# Patient Record
Sex: Female | Born: 1950 | Race: White | Hispanic: No | Marital: Married | Smoking: Current every day smoker
Health system: Southern US, Community
[De-identification: ages and names within clinical notes are randomized; demographics above are authoritative.]

## PROBLEM LIST (undated history)

## (undated) DIAGNOSIS — M069 Rheumatoid arthritis, unspecified: Secondary | ICD-10-CM

## (undated) DIAGNOSIS — F419 Anxiety disorder, unspecified: Secondary | ICD-10-CM

## (undated) DIAGNOSIS — D649 Anemia, unspecified: Secondary | ICD-10-CM

## (undated) DIAGNOSIS — L408 Other psoriasis: Secondary | ICD-10-CM

## (undated) DIAGNOSIS — M797 Fibromyalgia: Secondary | ICD-10-CM

## (undated) DIAGNOSIS — M1990 Unspecified osteoarthritis, unspecified site: Secondary | ICD-10-CM

## (undated) DIAGNOSIS — E785 Hyperlipidemia, unspecified: Secondary | ICD-10-CM

## (undated) DIAGNOSIS — R011 Cardiac murmur, unspecified: Secondary | ICD-10-CM

## (undated) DIAGNOSIS — M8580 Other specified disorders of bone density and structure, unspecified site: Secondary | ICD-10-CM

## (undated) DIAGNOSIS — K219 Gastro-esophageal reflux disease without esophagitis: Secondary | ICD-10-CM

## (undated) HISTORY — DX: Other specified disorders of bone density and structure, unspecified site: M85.80

## (undated) HISTORY — DX: Other psoriasis: L40.8

## (undated) HISTORY — PX: EYE SURGERY: SHX253

## (undated) HISTORY — DX: Gastro-esophageal reflux disease without esophagitis: K21.9

## (undated) HISTORY — DX: Unspecified osteoarthritis, unspecified site: M19.90

## (undated) HISTORY — DX: Hyperlipidemia, unspecified: E78.5

## (undated) HISTORY — PX: CARPAL TUNNEL RELEASE: SHX101

## (undated) HISTORY — PX: ABDOMINAL HYSTERECTOMY: SHX81

---

## 1978-08-21 HISTORY — PX: ABDOMINAL HYSTERECTOMY: SHX81

## 1999-11-08 ENCOUNTER — Other Ambulatory Visit: Admission: RE | Admit: 1999-11-08 | Discharge: 1999-11-08 | Payer: Self-pay | Admitting: *Deleted

## 1999-11-09 ENCOUNTER — Other Ambulatory Visit: Admission: RE | Admit: 1999-11-09 | Discharge: 1999-11-09 | Payer: Self-pay | Admitting: *Deleted

## 2004-10-17 ENCOUNTER — Ambulatory Visit: Payer: Self-pay | Admitting: Obstetrics and Gynecology

## 2005-05-05 ENCOUNTER — Ambulatory Visit: Payer: Self-pay | Admitting: Specialist

## 2006-07-24 ENCOUNTER — Ambulatory Visit: Payer: Self-pay | Admitting: Obstetrics and Gynecology

## 2008-06-25 ENCOUNTER — Ambulatory Visit: Payer: Self-pay | Admitting: Rheumatology

## 2012-05-10 ENCOUNTER — Ambulatory Visit (INDEPENDENT_AMBULATORY_CARE_PROVIDER_SITE_OTHER): Payer: Medicare Other | Admitting: Internal Medicine

## 2012-05-10 ENCOUNTER — Encounter: Payer: Self-pay | Admitting: Internal Medicine

## 2012-05-10 VITALS — BP 130/90 | HR 90 | Temp 98.2°F | Ht 62.0 in | Wt 159.8 lb

## 2012-05-10 DIAGNOSIS — Z1239 Encounter for other screening for malignant neoplasm of breast: Secondary | ICD-10-CM

## 2012-05-10 DIAGNOSIS — M069 Rheumatoid arthritis, unspecified: Secondary | ICD-10-CM

## 2012-05-10 DIAGNOSIS — IMO0001 Reserved for inherently not codable concepts without codable children: Secondary | ICD-10-CM | POA: Insufficient documentation

## 2012-05-10 DIAGNOSIS — F17200 Nicotine dependence, unspecified, uncomplicated: Secondary | ICD-10-CM

## 2012-05-10 DIAGNOSIS — Z720 Tobacco use: Secondary | ICD-10-CM

## 2012-05-10 DIAGNOSIS — R252 Cramp and spasm: Secondary | ICD-10-CM

## 2012-05-10 DIAGNOSIS — E785 Hyperlipidemia, unspecified: Secondary | ICD-10-CM

## 2012-05-10 NOTE — Assessment & Plan Note (Signed)
Patient with some intermittent muscle cramping. Will get recent lab reports from her rheumatologist including electrolytes, thyroid function, and B12.

## 2012-05-10 NOTE — Assessment & Plan Note (Signed)
Strongly encouraged patient to quit smoking.

## 2012-05-10 NOTE — Progress Notes (Signed)
Subjective:    Patient ID: Wanda Cervantes, female    DOB: [birth year], 61 y.o.   MRN: [id number]  HPI 61 year old female with history of rheumatoid arthritis, osteoarthritis, hyperlipidemia presents to establish care. In regards to rheumatoid arthritis, she reports significant pain in the joints in her feet, legs, and arms. She is currently on disability because of this. She currently uses Naprosyn to help with her symptoms. She has tried methotrexate in the past but was unable to tolerate this medication. She reports that her rheumatologist has offered to try other medications but she has been reluctant because of side effects. She reports that pain is most significant in the morning but then improves throughout the day. She has also undergone steroid injections in her feet in the past with some improvement. She read a report that suggested naltrexone could be used to help treat rheumatoid arthritis. She is interested in starting this medication. She reports that her rheumatologist refused to start the medicine.  She also reports a history of hyperlipidemia. She reports that previous doctors had tried using statin medications but she had sensitivity to these medicines. She would prefer to control lipids with diet.  She is also concerned today about several month history of diffuse cramping in the muscles of her legs, arms, and trunk. This occurs intermittently without exertion. She notes that her rheumatologist recently to labs to try to investigate the cause of cramping but she is unsure of the results of these labs.  Outpatient Encounter Prescriptions as of 05/10/2012  Medication Sig Dispense Refill  . naproxen sodium (ANAPROX) 220 MG tablet Take 220 mg by mouth as needed.       BP 130/90  Pulse 90  Temp 98.2 F (36.8 C) (Oral)  Ht 5\' 2"  (1.575 m)  Wt 159 lb 12 oz (72.462 kg)  BMI 29.22 kg/m2  SpO2 98%  Review of Systems  Constitutional: Negative for fever, chills, appetite change,  fatigue and unexpected weight change.  HENT: Negative for ear pain, congestion, sore throat, trouble swallowing, neck pain, voice change and sinus pressure.   Eyes: Negative for visual disturbance.  Respiratory: Negative for cough, shortness of breath, wheezing and stridor.   Cardiovascular: Negative for chest pain, palpitations and leg swelling.  Gastrointestinal: Negative for nausea, vomiting, abdominal pain, diarrhea, constipation, blood in stool, abdominal distention and anal bleeding.  Genitourinary: Negative for dysuria and flank pain.  Musculoskeletal: Positive for myalgias and arthralgias. Negative for gait problem.  Skin: Negative for color change and rash.  Neurological: Negative for dizziness and headaches.  Hematological: Negative for adenopathy. Does not bruise/bleed easily.  Psychiatric/Behavioral: Negative for suicidal ideas, disturbed wake/sleep cycle and dysphoric mood. The patient is not nervous/anxious.        Objective:   Physical Exam  Constitutional: She is oriented to person, place, and time. She appears well-developed and well-nourished. No distress.  HENT:  Head: Normocephalic and atraumatic.  Right Ear: External ear normal.  Left Ear: External ear normal.  Nose: Nose normal.  Mouth/Throat: Oropharynx is clear and moist. No oropharyngeal exudate.  Eyes: Conjunctivae normal are normal. Pupils are equal, round, and reactive to light. Right eye exhibits no discharge. Left eye exhibits no discharge. No scleral icterus.  Neck: Normal range of motion. Neck supple. No tracheal deviation present. No thyromegaly present.  Cardiovascular: Normal rate, regular rhythm, normal heart sounds and intact distal pulses.  Exam reveals no gallop and no friction rub.   No murmur heard. Pulmonary/Chest: Effort normal and breath sounds  normal. No respiratory distress. She has no wheezes. She has no rales. She exhibits no tenderness.  Abdominal: Soft. Bowel sounds are normal. She  exhibits no distension and no mass. There is no tenderness. There is no rebound and no guarding.  Musculoskeletal: She exhibits no edema and no tenderness.       Right foot: She exhibits decreased range of motion and tenderness.       Left foot: She exhibits decreased range of motion and tenderness.  Lymphadenopathy:    She has no cervical adenopathy.  Neurological: She is alert and oriented to person, place, and time. No cranial nerve deficit. She exhibits normal muscle tone. Coordination normal.  Skin: Skin is warm and dry. No rash noted. She is not diaphoretic. No erythema. No pallor.  Psychiatric: She has a normal mood and affect. Her behavior is normal. Judgment and thought content normal.          Assessment & Plan:

## 2012-05-10 NOTE — Assessment & Plan Note (Signed)
Patient reports history of rheumatoid arthritis. Followed by rheumatology. She has not been able to tolerate methotrexate and prefers not to try standard medications for rheumatoid arthritis because of potential side effects. She continues on Naprosyn with some improvement in her symptoms. She is interested in trying naltrexone. I could not find any data to support the use of this medication. Will request notes from rheumatology.

## 2012-05-10 NOTE — Assessment & Plan Note (Signed)
Patient reports hyperlipidemia in the past. Will get recent lab results from her rheumatologist.

## 2012-05-10 NOTE — Assessment & Plan Note (Signed)
Will set up screening mammogram. 

## 2012-05-16 ENCOUNTER — Other Ambulatory Visit: Payer: Medicare Other

## 2012-05-16 LAB — FECAL OCCULT BLOOD, IMMUNOCHEMICAL: Fecal Occult Bld: NEGATIVE

## 2012-05-21 ENCOUNTER — Telehealth: Payer: Self-pay | Admitting: Internal Medicine

## 2012-05-21 NOTE — Telephone Encounter (Signed)
Dr. Bedelia Person detected a urinary tract infection. Patient is needing a prescription called into [facility] on [address].

## 2012-05-22 NOTE — Telephone Encounter (Signed)
I don't have any results on this or notes from Dr. Rozanna Box. Pt will need to either be seen here in urgent visit for UTI, or ask Dr. Rozanna Box to call in Rx.

## 2012-05-22 NOTE — Telephone Encounter (Signed)
Called patient x 2 on home number lines was busy both times.  I will call back later.

## 2012-05-23 ENCOUNTER — Other Ambulatory Visit: Payer: Self-pay | Admitting: Internal Medicine

## 2012-05-23 ENCOUNTER — Ambulatory Visit (INDEPENDENT_AMBULATORY_CARE_PROVIDER_SITE_OTHER): Payer: Medicare Other | Admitting: Internal Medicine

## 2012-05-23 ENCOUNTER — Telehealth: Payer: Self-pay | Admitting: Internal Medicine

## 2012-05-23 DIAGNOSIS — R300 Dysuria: Secondary | ICD-10-CM

## 2012-05-23 LAB — POCT URINALYSIS DIPSTICK
Leukocytes, UA: NEGATIVE
Nitrite, UA: NEGATIVE
Urobilinogen, UA: 0.2
pH, UA: 7

## 2012-05-23 NOTE — Telephone Encounter (Signed)
Pt is wanting a call about UTI that she has going on.

## 2012-05-23 NOTE — Telephone Encounter (Signed)
Patient coming in today for nurse visit to leave urine sample. 

## 2012-05-23 NOTE — Telephone Encounter (Signed)
Patient advised that she can come in to leave a urine sample today.  She will come in before 12:00 noon.

## 2012-05-24 ENCOUNTER — Other Ambulatory Visit: Payer: Self-pay | Admitting: Internal Medicine

## 2012-05-24 DIAGNOSIS — N390 Urinary tract infection, site not specified: Secondary | ICD-10-CM

## 2012-05-25 MED ORDER — CIPROFLOXACIN HCL 500 MG PO TABS: 500.0000 mg | ORAL_TABLET | Freq: Two times a day (BID) | ORAL | Status: DC

## 2012-05-25 NOTE — Addendum Note (Signed)
Addended by: Ronna Polio A on: 05/25/2012 11:44 AM   Modules accepted: Orders

## 2012-06-14 ENCOUNTER — Ambulatory Visit: Payer: Self-pay | Admitting: Internal Medicine

## 2012-06-30 LAB — HM PAP SMEAR: HM PAP: NEGATIVE

## 2012-07-03 ENCOUNTER — Encounter: Payer: Self-pay | Admitting: Internal Medicine

## 2012-07-03 ENCOUNTER — Ambulatory Visit (INDEPENDENT_AMBULATORY_CARE_PROVIDER_SITE_OTHER): Payer: Medicare Other | Admitting: Internal Medicine

## 2012-07-03 ENCOUNTER — Other Ambulatory Visit (HOSPITAL_COMMUNITY)
Admission: RE | Admit: 2012-07-03 | Discharge: 2012-07-03 | Disposition: A | Discharge status: Home / Self Care | Payer: Medicare Other | Source: Ambulatory Visit | Attending: Internal Medicine | Admitting: Internal Medicine

## 2012-07-03 VITALS — BP 130/80 | HR 95 | Temp 97.6°F | Ht 62.0 in | Wt 161.0 lb

## 2012-07-03 DIAGNOSIS — Z01419 Encounter for gynecological examination (general) (routine) without abnormal findings: Secondary | ICD-10-CM | POA: Insufficient documentation

## 2012-07-03 DIAGNOSIS — Z0000 Encounter for general adult medical examination without abnormal findings: Secondary | ICD-10-CM | POA: Insufficient documentation

## 2012-07-03 DIAGNOSIS — Z1151 Encounter for screening for human papillomavirus (HPV): Secondary | ICD-10-CM | POA: Insufficient documentation

## 2012-07-03 DIAGNOSIS — Z23 Encounter for immunization: Secondary | ICD-10-CM

## 2012-07-03 DIAGNOSIS — E785 Hyperlipidemia, unspecified: Secondary | ICD-10-CM

## 2012-07-03 DIAGNOSIS — R252 Cramp and spasm: Secondary | ICD-10-CM

## 2012-07-03 LAB — CBC WITH DIFFERENTIAL/PLATELET
Basophils Absolute: 0.1 10*3/uL (ref 0.0–0.1)
Eosinophils Absolute: 0.3 10*3/uL (ref 0.0–0.7)
Lymphocytes Relative: 24.8 % (ref 12.0–46.0)
MCHC: 33 g/dL (ref 30.0–36.0)
Monocytes Absolute: 0.7 10*3/uL (ref 0.1–1.0)
Neutro Abs: 5.2 10*3/uL (ref 1.4–7.7)
Neutrophils Relative %: 62.4 % (ref 43.0–77.0)
RDW: 13.9 % (ref 11.5–14.6)

## 2012-07-03 LAB — COMPREHENSIVE METABOLIC PANEL
BUN: 18 mg/dL (ref 6–23)
CO2: 28 mEq/L (ref 19–32)
Calcium: 9.5 mg/dL (ref 8.4–10.5)
Chloride: 101 mEq/L (ref 96–112)
Creatinine, Ser: 0.7 mg/dL (ref 0.4–1.2)
GFR: 99.19 mL/min (ref >60.00)
Glucose, Bld: 102 mg/dL — ABNORMAL HIGH (ref 70–99)

## 2012-07-03 LAB — LIPID PANEL
HDL: 77.3 mg/dL (ref >39.00)
Triglycerides: 69 mg/dL (ref 0.0–149.0)

## 2012-07-03 LAB — VITAMIN B12: Vitamin B-12: 359 pg/mL (ref 211–911)

## 2012-07-03 LAB — LDL CHOLESTEROL, DIRECT: Direct LDL: 175.5 mg/dL

## 2012-07-03 NOTE — Assessment & Plan Note (Signed)
Patient with some persistent muscle cramping. Will check CBC, CMP, and B12 level with labs today.

## 2012-07-03 NOTE — Progress Notes (Signed)
Subjective:    Patient ID: Wanda Cervantes, female    DOB: 03/08/1951, 61 y.o.   MRN: [id number]  HPI 61 year old female with history of rheumatoid arthritis presents for annual exam. She reports she is generally feeling well. She notes some persistent cramping of muscles in her legs which occurs both at rest and on exertion. Reviewed labs from her rheumatologist but they did not include B12 level. She questions whether she may have B12 deficiency. Aside from this, she reports she is feeling well. She recently had a mammogram which she reports was normal. She declines colonoscopy.  Outpatient Encounter Prescriptions as of 07/03/2012  Medication Sig Dispense Refill  . naproxen sodium (ANAPROX) 220 MG tablet Take 220 mg by mouth as needed.      . [DISCONTINUED] ciprofloxacin (CIPRO) 500 MG tablet Take 1 tablet (500 mg total) by mouth 2 (two) times daily.  14 tablet  0   BP 130/80  Pulse 95  Temp 97.6 F (36.4 C) (Oral)  Ht 5\' 2"  (1.575 m)  Wt 161 lb (73.029 kg)  BMI 29.45 kg/m2  SpO2 97%  Review of Systems  Constitutional: Negative for fever, chills, appetite change, fatigue and unexpected weight change.  HENT: Negative for ear pain, congestion, sore throat, trouble swallowing, neck pain, voice change and sinus pressure.   Eyes: Negative for visual disturbance.  Respiratory: Negative for cough, shortness of breath, wheezing and stridor.   Cardiovascular: Negative for chest pain, palpitations and leg swelling.  Gastrointestinal: Negative for nausea, vomiting, abdominal pain, diarrhea, constipation, blood in stool, abdominal distention and anal bleeding.  Genitourinary: Negative for dysuria and flank pain.  Musculoskeletal: Positive for myalgias and arthralgias. Negative for gait problem.  Skin: Negative for color change and rash.  Neurological: Negative for dizziness and headaches.  Hematological: Negative for adenopathy. Does not bruise/bleed easily.  Psychiatric/Behavioral:  Negative for suicidal ideas, sleep disturbance and dysphoric mood. The patient is not nervous/anxious.        Objective:   Physical Exam  Constitutional: She is oriented to person, place, and time. She appears well-developed and well-nourished. No distress.  HENT:  Head: Normocephalic and atraumatic.  Right Ear: External ear normal.  Left Ear: External ear normal.  Nose: Nose normal.  Mouth/Throat: Oropharynx is clear and moist. No oropharyngeal exudate.  Eyes: Conjunctivae normal are normal. Pupils are equal, round, and reactive to light. Right eye exhibits no discharge. Left eye exhibits no discharge. No scleral icterus.  Neck: Normal range of motion. Neck supple. No tracheal deviation present. No thyromegaly present.  Cardiovascular: Normal rate, regular rhythm, normal heart sounds and intact distal pulses.  Exam reveals no gallop and no friction rub.   No murmur heard. Pulmonary/Chest: Effort normal and breath sounds normal. No respiratory distress. She has no wheezes. She has no rales. She exhibits no tenderness.  Abdominal: Soft. Bowel sounds are normal. She exhibits no distension and no mass. There is no tenderness. There is no rebound and no guarding.  Genitourinary: Rectum normal, vagina normal and uterus normal. No breast swelling, tenderness, discharge or bleeding. Pelvic exam was performed with patient supine. There is no rash, tenderness or lesion on the right labia. There is no rash, tenderness or lesion on the left labia. Uterus is not enlarged and not tender. Cervix exhibits no motion tenderness, no discharge and no friability. Right adnexum displays no mass, no tenderness and no fullness. Left adnexum displays no mass, no tenderness and no fullness. No erythema or tenderness around the vagina. No  vaginal discharge found.  Musculoskeletal: Normal range of motion. She exhibits no edema and no tenderness.  Lymphadenopathy:    She has no cervical adenopathy.  Neurological: She is  alert and oriented to person, place, and time. No cranial nerve deficit. She exhibits normal muscle tone. Coordination normal.  Skin: Skin is warm and dry. No rash noted. She is not diaphoretic. No erythema. No pallor.  Psychiatric: She has a normal mood and affect. Her behavior is normal. Judgment and thought content normal.          Assessment & Plan:

## 2012-07-03 NOTE — Assessment & Plan Note (Signed)
General medical exam normal today including breast and pelvic exam. PAP is pending. Will request records on recent mammogram. Will check basic labs today including CBC, CMP, lipids. Follow up 3 months and prn.

## 2012-07-05 ENCOUNTER — Encounter: Payer: Self-pay | Admitting: *Deleted

## 2013-01-01 ENCOUNTER — Ambulatory Visit: Payer: Medicare Other | Admitting: Internal Medicine

## 2013-02-25 ENCOUNTER — Ambulatory Visit: Payer: Medicare Other | Admitting: Internal Medicine

## 2013-03-31 ENCOUNTER — Ambulatory Visit (INDEPENDENT_AMBULATORY_CARE_PROVIDER_SITE_OTHER): Payer: Medicare Other | Admitting: Internal Medicine

## 2013-03-31 ENCOUNTER — Encounter: Payer: Self-pay | Admitting: Internal Medicine

## 2013-03-31 VITALS — BP 152/88 | HR 79 | Temp 98.3°F | Wt 157.0 lb

## 2013-03-31 DIAGNOSIS — L723 Sebaceous cyst: Secondary | ICD-10-CM | POA: Insufficient documentation

## 2013-03-31 DIAGNOSIS — F17200 Nicotine dependence, unspecified, uncomplicated: Secondary | ICD-10-CM

## 2013-03-31 DIAGNOSIS — E785 Hyperlipidemia, unspecified: Secondary | ICD-10-CM

## 2013-03-31 DIAGNOSIS — R252 Cramp and spasm: Secondary | ICD-10-CM

## 2013-03-31 DIAGNOSIS — Z720 Tobacco use: Secondary | ICD-10-CM

## 2013-03-31 LAB — COMPREHENSIVE METABOLIC PANEL
ALT: 18 U/L (ref 0–35)
Albumin: 4.3 g/dL (ref 3.5–5.2)
CO2: 24 mEq/L (ref 19–32)
Calcium: 9.6 mg/dL (ref 8.4–10.5)
Chloride: 105 mEq/L (ref 96–112)
Creatinine, Ser: 0.6 mg/dL (ref 0.4–1.2)
GFR: 108.52 mL/min (ref >60.00)
Potassium: 4.1 mEq/L (ref 3.5–5.1)

## 2013-03-31 LAB — CBC WITH DIFFERENTIAL/PLATELET
Basophils Absolute: 0 10*3/uL (ref 0.0–0.1)
Lymphocytes Relative: 26.9 % (ref 12.0–46.0)
Monocytes Relative: 8.1 % (ref 3.0–12.0)
Platelets: 270 10*3/uL (ref 150.0–400.0)
RDW: 14.9 % — ABNORMAL HIGH (ref 11.5–14.6)

## 2013-03-31 LAB — CK: Total CK: 75 U/L (ref 7–177)

## 2013-03-31 LAB — VITAMIN B12: Vitamin B-12: 285 pg/mL (ref 211–911)

## 2013-03-31 MED ORDER — ROSUVASTATIN CALCIUM 5 MG PO TABS: 5.0000 mg | ORAL_TABLET | Freq: Every day | ORAL | Status: DC

## 2013-03-31 NOTE — Assessment & Plan Note (Signed)
Strongly encouraged smoking cessation. 

## 2013-03-31 NOTE — Progress Notes (Signed)
Subjective:    Patient ID: Wanda Cervantes, female    DOB: [birth year], 62 y.o.   MRN: [id number]  HPI 62 year old female with history of rheumatoid arthritis, tobacco abuse presents for followup. Her primary concern today is diffuse myalgias. This is described as muscle cramping which occurs in her legs and trunk throughout the day. Her rheumatologist started her on a muscle relaxer. She is unsure of the name of this medication. She does not find it to be helpful. The cramping resolves without any intervention.  In regards to hyperlipidemia, she reports she is trying to follow a healthier diet. In the past, she took Lipitor with worsening muscle pain. She would like to consider an alternative medication. She notes a strong family history of high cholesterol and heart disease. She continues to smoke.  Outpatient Encounter Prescriptions as of 03/31/2013  Medication Sig Dispense Refill  . naproxen sodium (ANAPROX) 220 MG tablet Take 220 mg by mouth as needed.       No facility-administered encounter medications on file as of 03/31/2013.   BP 152/88  Pulse 79  Temp(Src) 98.3 F (36.8 C) (Oral)  Wt 157 lb (71.215 kg)  BMI 28.71 kg/m2  SpO2 97%  Review of Systems  Constitutional: Negative for fever, chills, appetite change, fatigue and unexpected weight change.  HENT: Negative for ear pain, congestion, sore throat, trouble swallowing, neck pain, voice change and sinus pressure.   Eyes: Negative for visual disturbance.  Respiratory: Negative for cough, shortness of breath, wheezing and stridor.   Cardiovascular: Negative for chest pain, palpitations and leg swelling.  Gastrointestinal: Negative for nausea, vomiting, abdominal pain, diarrhea, constipation, blood in stool, abdominal distention and anal bleeding.  Genitourinary: Negative for dysuria and flank pain.  Musculoskeletal: Positive for myalgias. Negative for arthralgias and gait problem.  Skin: Negative for color change and rash.   Neurological: Negative for dizziness and headaches.  Hematological: Negative for adenopathy. Does not bruise/bleed easily.  Psychiatric/Behavioral: Negative for suicidal ideas, sleep disturbance and dysphoric mood. The patient is not nervous/anxious.        Objective:   Physical Exam  Constitutional: She is oriented to person, place, and time. She appears well-developed and well-nourished. No distress.  HENT:  Head: Normocephalic and atraumatic.  Right Ear: External ear normal.  Left Ear: External ear normal.  Nose: Nose normal.  Mouth/Throat: Oropharynx is clear and moist. No oropharyngeal exudate.  Eyes: Conjunctivae are normal. Pupils are equal, round, and reactive to light. Right eye exhibits no discharge. Left eye exhibits no discharge. No scleral icterus.  Neck: Normal range of motion. Neck supple. No tracheal deviation present. No thyromegaly present.  Cardiovascular: Normal rate, regular rhythm, normal heart sounds and intact distal pulses.  Exam reveals no gallop and no friction rub.   No murmur heard. Pulmonary/Chest: Effort normal and breath sounds normal. No accessory muscle usage. Not tachypneic. No respiratory distress. She has no decreased breath sounds. She has no wheezes. She has no rhonchi. She has no rales. She exhibits no tenderness.  Musculoskeletal: Normal range of motion. She exhibits no edema and no tenderness.  Lymphadenopathy:    She has no cervical adenopathy.  Neurological: She is alert and oriented to person, place, and time. No cranial nerve deficit. She exhibits normal muscle tone. Coordination normal.  Skin: Skin is warm and dry. No rash noted. She is not diaphoretic. No erythema. No pallor.  Psychiatric: She has a normal mood and affect. Her behavior is normal. Judgment and thought content normal.  Assessment & Plan:

## 2013-03-31 NOTE — Assessment & Plan Note (Signed)
Will recheck electrolytes, thyroid function, total CK with labs today. Strongly encouraged smoking cessation. Patient will get the name of the muscle relaxer she is using and e-mail or call our office at this.

## 2013-03-31 NOTE — Assessment & Plan Note (Signed)
Recurrent sebaceous cyst on the mid upper scalp. Recommended dermatology evaluation for resection. She would like to hold off on this for now. Will followup in 4 weeks.

## 2013-03-31 NOTE — Assessment & Plan Note (Signed)
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 Based on 10 year ASCVD risk calculator her ten-year risk for CVD is 8.9% and recommendation would be to start statin medication. Discussed this with her today. Will try Crestor 5 mg daily. Plan to repeat lipids and LFTs in one month.

## 2013-04-04 ENCOUNTER — Ambulatory Visit (INDEPENDENT_AMBULATORY_CARE_PROVIDER_SITE_OTHER): Payer: Medicare Other | Admitting: *Deleted

## 2013-04-04 DIAGNOSIS — E538 Deficiency of other specified B group vitamins: Secondary | ICD-10-CM

## 2013-04-04 MED ORDER — CYANOCOBALAMIN 1000 MCG/ML IJ SOLN
1000.0000 ug | Freq: Once | INTRAMUSCULAR | Status: AC
  Administered 2013-04-04: 1000 ug via INTRAMUSCULAR

## 2013-04-10 ENCOUNTER — Ambulatory Visit (INDEPENDENT_AMBULATORY_CARE_PROVIDER_SITE_OTHER): Payer: Medicare Other | Admitting: *Deleted

## 2013-04-10 DIAGNOSIS — E538 Deficiency of other specified B group vitamins: Secondary | ICD-10-CM

## 2013-04-10 MED ORDER — CYANOCOBALAMIN 1000 MCG/ML IJ SOLN
1000.0000 ug | Freq: Once | INTRAMUSCULAR | Status: AC
  Administered 2013-04-10: 1000 ug via INTRAMUSCULAR

## 2013-04-11 ENCOUNTER — Telehealth: Payer: Self-pay | Admitting: *Deleted

## 2013-04-11 ENCOUNTER — Ambulatory Visit: Payer: Medicare Other

## 2013-04-11 NOTE — Telephone Encounter (Signed)
There are no steroids in this medication. However, I agree she should stop the medication. We should list as allergy

## 2013-04-11 NOTE — Telephone Encounter (Signed)
Patient left a voicemail stating she was having some facial swelling since starting the Crestor. She has also noticed more sensitivity in her skin, was wondering if any steroids was in that medication. Called and spoke with patient, informed her that she should not take anymore of the medication until I spoke to Dr. Dan Humphreys about this.

## 2013-04-14 NOTE — Telephone Encounter (Signed)
LMTCB

## 2013-04-15 NOTE — Telephone Encounter (Signed)
I would recommend following a Mediterranean style diet and setting a goal of aerobic exercise 3x per week.

## 2013-04-15 NOTE — Telephone Encounter (Signed)
Patient informed and verbalized understanding. Sent a copy of diet.

## 2013-04-15 NOTE — Telephone Encounter (Signed)
Spoke with patient, she did stop taking the medication. But she would like to know if taking Omega-3 fish oil help lower her cholesterol or is there anything that could help with that. Please advise.

## 2013-04-17 ENCOUNTER — Ambulatory Visit (INDEPENDENT_AMBULATORY_CARE_PROVIDER_SITE_OTHER): Payer: Medicare Other | Admitting: *Deleted

## 2013-04-17 DIAGNOSIS — E538 Deficiency of other specified B group vitamins: Secondary | ICD-10-CM

## 2013-04-17 MED ORDER — CYANOCOBALAMIN 1000 MCG/ML IJ SOLN
1000.0000 ug | Freq: Once | INTRAMUSCULAR | Status: AC
  Administered 2013-04-17: 1000 ug via INTRAMUSCULAR

## 2013-05-05 ENCOUNTER — Ambulatory Visit: Payer: Medicare Other | Admitting: Internal Medicine

## 2013-05-20 ENCOUNTER — Ambulatory Visit (INDEPENDENT_AMBULATORY_CARE_PROVIDER_SITE_OTHER): Payer: Medicare Other | Admitting: *Deleted

## 2013-05-20 DIAGNOSIS — E538 Deficiency of other specified B group vitamins: Secondary | ICD-10-CM

## 2013-05-20 MED ORDER — CYANOCOBALAMIN 1000 MCG/ML IJ SOLN
1000.0000 ug | Freq: Once | INTRAMUSCULAR | Status: AC
  Administered 2013-05-20: 1000 ug via INTRAMUSCULAR

## 2013-06-24 ENCOUNTER — Ambulatory Visit (INDEPENDENT_AMBULATORY_CARE_PROVIDER_SITE_OTHER): Payer: Medicare Other | Admitting: *Deleted

## 2013-06-24 DIAGNOSIS — E538 Deficiency of other specified B group vitamins: Secondary | ICD-10-CM

## 2013-06-24 MED ORDER — CYANOCOBALAMIN 1000 MCG/ML IJ SOLN
1000.0000 ug | Freq: Once | INTRAMUSCULAR | Status: AC
  Administered 2013-06-24: 1000 ug via INTRAMUSCULAR

## 2013-06-30 LAB — HM MAMMOGRAPHY: HM Mammogram: NORMAL

## 2013-07-01 ENCOUNTER — Ambulatory Visit: Payer: Self-pay | Admitting: Internal Medicine

## 2013-07-08 ENCOUNTER — Telehealth: Payer: Self-pay | Admitting: *Deleted

## 2013-07-08 NOTE — Telephone Encounter (Signed)
She will need to be seen in a visit.

## 2013-07-08 NOTE — Telephone Encounter (Signed)
Patient left a message on voicemail stating she went to [facility] over the weekend and was diagnosed with shingles. They did give her some pain medication but it is not helping. She has enough family members in this field that agree she need something more. It does not have to be another pain med, maybe something to help with the nerve ending. Called and spoke with patient they gave her acyclovir 800 mg and Hydrocodone APAP 5/325 mg. She has a Engineer, civil (consulting) and a 911 dispatcher in the family and they both agree that something in addition to this should be given to her to help with the nerve pain.

## 2013-07-08 NOTE — Telephone Encounter (Signed)
Informed patient she will need to come in for an office visit, appointment scheduled for tomorrow with Raquel.

## 2013-07-09 ENCOUNTER — Ambulatory Visit (INDEPENDENT_AMBULATORY_CARE_PROVIDER_SITE_OTHER): Payer: Medicare Other | Admitting: Adult Health

## 2013-07-09 VITALS — BP 132/74 | HR 90 | Temp 98.2°F | Wt 159.0 lb

## 2013-07-09 DIAGNOSIS — B0229 Other postherpetic nervous system involvement: Secondary | ICD-10-CM

## 2013-07-09 MED ORDER — GABAPENTIN 300 MG PO CAPS: 300.0000 mg | ORAL_CAPSULE | Freq: Three times a day (TID) | ORAL | Status: DC

## 2013-07-09 NOTE — Patient Instructions (Signed)
Start gabapentin 300 mg three times a day. Calamine lotion to the area as needed.   Shingles Shingles (herpes zoster) is an infection that is caused by the same virus that causes chickenpox (varicella). The infection causes a painful skin rash and fluid-filled blisters, which eventually break open, crust over, and heal. It may occur in any area of the body, but it usually affects only one side of the body or face. The pain of shingles usually lasts about 1 month. However, some people with shingles may develop long-term (chronic) pain in the affected area of the body. Shingles often occurs many years after the person had chickenpox. It is more common:  In people older than 50 years.  In people with weakened immune systems, such as those with HIV, AIDS, or cancer.  In people taking medicines that weaken the immune system, such as transplant medicines.  In people under great stress. CAUSES  Shingles is caused by the varicella zoster virus (VZV), which also causes chickenpox. After a person is infected with the virus, it can remain in the person's body for years in an inactive state (dormant). To cause shingles, the virus reactivates and breaks out as an infection in a nerve root. The virus can be spread from person to person (contagious) through contact with open blisters of the shingles rash. It will only spread to people who have not had chickenpox. When these people are exposed to the virus, they may develop chickenpox. They will not develop shingles. Once the blisters scab over, the person is no longer contagious and cannot spread the virus to others. SYMPTOMS  Shingles shows up in stages. The initial symptoms may be pain, itching, and tingling in an area of the skin. This pain is usually described as burning, stabbing, or throbbing.In a few days or weeks, a painful red rash will appear in the area where the pain, itching, and tingling were felt. The rash is usually on one side of the body in a  band or belt-like pattern. Then, the rash usually turns into fluid-filled blisters. They will scab over and dry up in approximately 2 3 weeks. Flu-like symptoms may also occur with the initial symptoms, the rash, or the blisters. These may include:  Fever.  Chills.  Headache.  Upset stomach. DIAGNOSIS  Your caregiver will perform a skin exam to diagnose shingles. Skin scrapings or fluid samples may also be taken from the blisters. This sample will be examined under a microscope or sent to a lab for further testing. TREATMENT  There is no specific cure for shingles. Your caregiver will likely prescribe medicines to help you manage the pain, recover faster, and avoid long-term problems. This may include antiviral drugs, anti-inflammatory drugs, and pain medicines. HOME CARE INSTRUCTIONS   Take a cool bath or apply cool compresses to the area of the rash or blisters as directed. This may help with the pain and itching.   Only take over-the-counter or prescription medicines as directed by your caregiver.   Rest as directed by your caregiver.  Keep your rash and blisters clean with mild soap and cool water or as directed by your caregiver.  Do not pick your blisters or scratch your rash. Apply an anti-itch cream or numbing creams to the affected area as directed by your caregiver.  Keep your shingles rash covered with a loose bandage (dressing).  Avoid skin contact with:  Babies.   Pregnant women.   Children with eczema.   Elderly people with transplants.  People with chronic illnesses, such as leukemia or AIDS.   Wear loose-fitting clothing to help ease the pain of material rubbing against the rash.  Keep all follow-up appointments with your caregiver.If the area involved is on your face, you may receive a referral for follow-up to a specialist, such as an eye doctor (ophthalmologist) or an ear, nose, and throat (ENT) doctor. Keeping all follow-up appointments will  help you avoid eye complications, chronic pain, or disability.  SEEK IMMEDIATE MEDICAL CARE IF:   You have facial pain, pain around the eye area, or loss of feeling on one side of your face.  You have ear pain or ringing in your ear.  You have loss of taste.  Your pain is not relieved with prescribed medicines.   Your redness or swelling spreads.   You have more pain and swelling.  Your condition is worsening or has changed.   You have a feveror persistent symptoms for more than 2 3 days.  You have a fever and your symptoms suddenly get worse. MAKE SURE YOU:  Understand these instructions.  Will watch your condition.  Will get help right away if you are not doing well or get worse. Document Released: 08/07/2005 Document Revised: 05/01/2012 Document Reviewed: 03/21/2012 [facility] Patient Information 2014 [place], [place].

## 2013-07-09 NOTE — Assessment & Plan Note (Signed)
Gabapentin 300 mg tid. RTC if no improvement in symptoms.

## 2013-07-09 NOTE — Progress Notes (Signed)
  Subjective:    Patient ID: Wanda Cervantes, female    DOB: [birth year], 62 y.o.   MRN: [id number]  HPI  Diagnosed with shingles and started on hydrocodone, acyclovir. She reports that hydrocodone is not helping her pain. Pain is rated 10/10. She reports that aleve helps somewhat.  Needs something specific for nerve pain.   Current Outpatient Prescriptions on File Prior to Visit  Medication Sig Dispense Refill  . methocarbamol (ROBAXIN) 500 MG tablet Take 500 mg by mouth 2 (two) times daily as needed.      . naproxen sodium (ANAPROX) 220 MG tablet Take 220 mg by mouth as needed.      . rosuvastatin (CRESTOR) 5 MG tablet Take 1 tablet (5 mg total) by mouth daily.  90 tablet  3   No current facility-administered medications on file prior to visit.     Review of Systems  Skin: Positive for rash.       Shingles right side of back and around to the abdomen  Neurological:       Nerve ending pain 2/2 shingles       Objective:   Physical Exam  Constitutional: She is oriented to person, place, and time. No distress.  Cardiovascular: Normal rate and regular rhythm.   Pulmonary/Chest: Effort normal. No respiratory distress.  Neurological: She is alert and oriented to person, place, and time.  Skin: Rash noted.  Shingles rash  Psychiatric: She has a normal mood and affect. Her behavior is normal. Judgment and thought content normal.    .BP 132/74  Pulse 90  Temp(Src) 98.2 F (36.8 C) (Oral)  Wt 159 lb (72.122 kg)  SpO2 97%      Assessment & Plan:

## 2013-07-11 ENCOUNTER — Encounter: Payer: Self-pay | Admitting: Internal Medicine

## 2013-07-24 ENCOUNTER — Ambulatory Visit (INDEPENDENT_AMBULATORY_CARE_PROVIDER_SITE_OTHER): Payer: Medicare Other | Admitting: *Deleted

## 2013-07-24 DIAGNOSIS — E538 Deficiency of other specified B group vitamins: Secondary | ICD-10-CM

## 2013-07-24 MED ORDER — CYANOCOBALAMIN 1000 MCG/ML IJ SOLN
1000.0000 ug | Freq: Once | INTRAMUSCULAR | Status: AC
  Administered 2013-07-24: 1000 ug via INTRAMUSCULAR

## 2013-08-26 ENCOUNTER — Encounter (INDEPENDENT_AMBULATORY_CARE_PROVIDER_SITE_OTHER): Payer: Self-pay

## 2013-08-26 ENCOUNTER — Ambulatory Visit (INDEPENDENT_AMBULATORY_CARE_PROVIDER_SITE_OTHER): Payer: Medicare HMO | Admitting: *Deleted

## 2013-08-26 DIAGNOSIS — E538 Deficiency of other specified B group vitamins: Secondary | ICD-10-CM

## 2013-08-26 MED ORDER — CYANOCOBALAMIN 1000 MCG/ML IJ SOLN
1000.0000 ug | Freq: Once | INTRAMUSCULAR | Status: AC
  Administered 2013-08-26: 1000 ug via INTRAMUSCULAR

## 2013-09-26 ENCOUNTER — Ambulatory Visit (INDEPENDENT_AMBULATORY_CARE_PROVIDER_SITE_OTHER): Payer: Medicare HMO | Admitting: *Deleted

## 2013-09-26 DIAGNOSIS — E538 Deficiency of other specified B group vitamins: Secondary | ICD-10-CM

## 2013-09-26 MED ORDER — CYANOCOBALAMIN 1000 MCG/ML IJ SOLN
1000.0000 ug | Freq: Once | INTRAMUSCULAR | Status: AC
  Administered 2013-09-26: 1000 ug via INTRAMUSCULAR

## 2013-09-30 ENCOUNTER — Telehealth: Payer: Self-pay | Admitting: Emergency Medicine

## 2013-09-30 NOTE — Telephone Encounter (Signed)
Referral underway for [facility]

## 2013-10-02 NOTE — Telephone Encounter (Signed)
[place] states referral not needed to [facility] provider

## 2013-10-28 ENCOUNTER — Ambulatory Visit (INDEPENDENT_AMBULATORY_CARE_PROVIDER_SITE_OTHER): Payer: Medicare HMO | Admitting: *Deleted

## 2013-10-28 ENCOUNTER — Telehealth: Payer: Self-pay | Admitting: *Deleted

## 2013-10-28 DIAGNOSIS — E538 Deficiency of other specified B group vitamins: Secondary | ICD-10-CM

## 2013-10-28 MED ORDER — CYANOCOBALAMIN 1000 MCG/ML IJ SOLN
1000.0000 ug | Freq: Once | INTRAMUSCULAR | Status: AC
  Administered 2013-10-28: 1000 ug via INTRAMUSCULAR

## 2013-10-28 NOTE — Telephone Encounter (Signed)
That is fine 

## 2013-10-28 NOTE — Telephone Encounter (Signed)
Pt notified, appt scheduled 11/18/13.

## 2013-10-28 NOTE — Telephone Encounter (Signed)
Pt in for her B12 injection. Asking if she can take B12 injections q 3 weeks due to muscle cramping that begins about 3 weeks after each injection.

## 2013-11-18 ENCOUNTER — Ambulatory Visit (INDEPENDENT_AMBULATORY_CARE_PROVIDER_SITE_OTHER): Payer: Medicare HMO | Admitting: *Deleted

## 2013-11-18 DIAGNOSIS — E538 Deficiency of other specified B group vitamins: Secondary | ICD-10-CM

## 2013-11-18 MED ORDER — CYANOCOBALAMIN 1000 MCG/ML IJ SOLN
1000.0000 ug | Freq: Once | INTRAMUSCULAR | Status: AC
  Administered 2013-11-18: 1000 ug via INTRAMUSCULAR

## 2013-12-09 ENCOUNTER — Ambulatory Visit (INDEPENDENT_AMBULATORY_CARE_PROVIDER_SITE_OTHER): Payer: Medicare HMO | Admitting: *Deleted

## 2013-12-09 DIAGNOSIS — E538 Deficiency of other specified B group vitamins: Secondary | ICD-10-CM

## 2013-12-09 MED ORDER — CYANOCOBALAMIN 1000 MCG/ML IJ SOLN
1000.0000 ug | Freq: Once | INTRAMUSCULAR | Status: AC
  Administered 2013-12-09: 1000 ug via INTRAMUSCULAR

## 2013-12-24 ENCOUNTER — Ambulatory Visit (INDEPENDENT_AMBULATORY_CARE_PROVIDER_SITE_OTHER): Payer: Commercial Managed Care - HMO | Admitting: Internal Medicine

## 2013-12-24 ENCOUNTER — Encounter: Payer: Self-pay | Admitting: Internal Medicine

## 2013-12-24 VITALS — BP 142/80 | HR 99 | Temp 97.7°F | Wt 159.0 lb

## 2013-12-24 DIAGNOSIS — Z0000 Encounter for general adult medical examination without abnormal findings: Secondary | ICD-10-CM | POA: Diagnosis not present

## 2013-12-24 DIAGNOSIS — L821 Other seborrheic keratosis: Secondary | ICD-10-CM | POA: Diagnosis not present

## 2013-12-24 DIAGNOSIS — M069 Rheumatoid arthritis, unspecified: Secondary | ICD-10-CM

## 2013-12-24 NOTE — Progress Notes (Signed)
Subjective:    Patient ID: Lenna Gilford, female    DOB: 1950-11-17, 63 y.o.   MRN: [id number]  HPI 63YO female presents for follow up.  RA - Followed in the past by Dr. Ruthell Rummage, but she is no longer on her insurance plan. Continues to have diffuse arthralgia, most prominent in her hands. Hands are stiff, particularly in the morning. Left shoulder also painful with limited ROM because of pain. Using Aleve with minimal improvement.  Mole - Concerned about mole on her back which is sometimes itchy. Unsure how long this has been present.  Review of Systems  Constitutional: Negative for fever, chills, appetite change, fatigue and unexpected weight change.  HENT: Negative for congestion, ear pain, sinus pressure, sore throat, trouble swallowing and voice change.   Eyes: Negative for visual disturbance.  Respiratory: Negative for cough, shortness of breath, wheezing and stridor.   Cardiovascular: Negative for chest pain, palpitations and leg swelling.  Gastrointestinal: Negative for nausea, vomiting, abdominal pain, diarrhea, constipation, blood in stool, abdominal distention and anal bleeding.  Genitourinary: Negative for dysuria and flank pain.  Musculoskeletal: Positive for arthralgias and joint swelling. Negative for gait problem, myalgias and neck pain.  Skin: Negative for color change and rash.  Neurological: Negative for dizziness and headaches.  Hematological: Negative for adenopathy. Does not bruise/bleed easily.  Psychiatric/Behavioral: Negative for suicidal ideas, sleep disturbance and dysphoric mood. The patient is not nervous/anxious.        Objective:    BP 142/80  Pulse 99  Temp(Src) 97.7 F (36.5 C) (Oral)  Wt 159 lb (72.122 kg)  SpO2 94% Physical Exam  Constitutional: She is oriented to person, place, and time. She appears well-developed and well-nourished. No distress.  HENT:  Head: Normocephalic and atraumatic.  Right Ear: External ear normal.  Left Ear:  External ear normal.  Nose: Nose normal.  Mouth/Throat: Oropharynx is clear and moist. No oropharyngeal exudate.  Eyes: Conjunctivae are normal. Pupils are equal, round, and reactive to light. Right eye exhibits no discharge. Left eye exhibits no discharge. No scleral icterus.  Neck: Normal range of motion. Neck supple. No tracheal deviation present. No thyromegaly present.  Cardiovascular: Normal rate, regular rhythm, normal heart sounds and intact distal pulses.  Exam reveals no gallop and no friction rub.   No murmur heard. Pulmonary/Chest: Effort normal and breath sounds normal. No accessory muscle usage. Not tachypneic. No respiratory distress. She has no decreased breath sounds. She has no wheezes. She has no rhonchi. She has no rales. She exhibits no tenderness.  Musculoskeletal: She exhibits no edema.       Left shoulder: She exhibits decreased range of motion, tenderness and pain.       Right hand: She exhibits decreased range of motion and tenderness.       Left hand: She exhibits decreased range of motion and tenderness.  Lymphadenopathy:    She has no cervical adenopathy.  Neurological: She is alert and oriented to person, place, and time. No cranial nerve deficit. She exhibits normal muscle tone. Coordination normal.  Skin: Skin is warm and dry. Lesion (brown papule middle back approx 1cm diameter) noted. No rash noted. She is not diaphoretic. No erythema. No pallor.  Psychiatric: She has a normal mood and affect. Her behavior is normal. Judgment and thought content normal.          Assessment & Plan:   Problem List Items Addressed This Visit   Rheumatoid arthritis - Primary     Progressive  rheumatoid arthritis. Has been unable to tolerate medications except for Naprosyn in the past. Will set up new rheumatology evaluation.     Relevant Orders      Ambulatory referral to Rheumatology   Seborrheic keratoses     Seborrheic keratosis mid back. Discussed benign nature of  these lesions. However, recommended general follow up for skin cancer screening with dermatology. Referral placed.    Relevant Orders      Ambulatory referral to Dermatology    Other Visit Diagnoses   Routine general medical examination at a health care facility        Relevant Orders       CBC with Differential       Comprehensive metabolic panel       Lipid panel       Microalbumin / creatinine urine ratio       Vit D  25 hydroxy (rtn osteoporosis monitoring)        Return in about 4 weeks (around 01/21/2014) for Physical.

## 2013-12-24 NOTE — Assessment & Plan Note (Signed)
Seborrheic keratosis mid back. Discussed benign nature of these lesions. However, recommended general follow up for skin cancer screening with dermatology. Referral placed.

## 2013-12-24 NOTE — Patient Instructions (Signed)
Seborrheic Keratosis  Seborrheic keratosis is a common, noncancerous (benign) skin growth that can occur anywhere on the skin.It looks like "stuck-on," waxy, rough, tan, brown, or black spots on the skin. These skin growths can be flat or raised.They are often called "barnacles" because of their pasted-on appearance.Usually, these skin growths appear in adulthood, around age 61, and increase in number as you age. They may also develop during pregnancy or following estrogen therapy. Many people may only have one growth appear in their lifetime, while some people may develop many growths.  CAUSES  It is unknown what causes these skin growths, but they appear to run in families.  SYMPTOMS  Seborrheic keratosis is often located on the face, chest, shoulders, back, or other areas. These growths are:   Usually painless, but may become irritated and itchy.   Yellow, brown, black, or other colors.   Slightly raised or have a flat surface.   Sometimes rough or wart-like in texture.   Often waxy on the surface.   Round or oval-shaped.   Sometimes "stuck-on" in appearance.   Sometimes single, but there are usually many growths.  Any growth that bleeds, itches on a regular basis, becomes inflamed, or becomes irritated needs to be evaluated by a skin specialist (dermatologist).  DIAGNOSIS  Diagnosis is mainly based on the way the growths appear. In some cases, it can be difficult to tell this type of skin growth from skin cancer. A skin growth tissue sample (biopsy) may be used to confirm the diagnosis.  TREATMENT  Most often, treatment is not needed because the skin growths are benign.If the skin growth is irritated easily by clothing or jewelry, causing it to scab or bleed, treatment may be recommended. Patients may also choose to have the growths removed because they do not like their appearance. Most commonly, these growths are treated with cryosurgery.   In cryosurgery, liquid nitrogen is applied to "freeze" the growth. The growth usually falls off within a matter of days. A blister may form and dry into a scab that will also fall off. After the growth or scab falls off, it may leave a dark or light spot on the skin. This color may fade over time, or it may remain permanent on the skin.  HOME CARE INSTRUCTIONS  If the skin growths are treated with cryosurgery, the treated area needs to be kept clean with water and soap.  SEEK MEDICAL CARE IF:   You have questions about these growths or other skin problems.   You develop new symptoms, including:   A change in the appearance of the skin growth.   New growths.   Any bleeding, itching, or pain in the growths.   A skin growth that looks similar to seborrheic keratosis.  Document Released: 09/09/2010 Document Revised: 10/30/2011 Document Reviewed: 09/09/2010  [employer] Patient Information 2014 [employer].

## 2013-12-24 NOTE — Assessment & Plan Note (Signed)
Progressive rheumatoid arthritis. Has been unable to tolerate medications except for Naprosyn in the past. Will set up new rheumatology evaluation.

## 2013-12-24 NOTE — Progress Notes (Signed)
Pre visit review using our clinic review tool, if applicable. No additional management support is needed unless otherwise documented below in the visit note. 

## 2013-12-25 ENCOUNTER — Telehealth: Payer: Self-pay | Admitting: Internal Medicine

## 2013-12-25 NOTE — Telephone Encounter (Signed)
Relevant patient education assigned to patient using Emmi. ° °

## 2013-12-30 ENCOUNTER — Ambulatory Visit: Payer: Medicare HMO

## 2014-01-02 ENCOUNTER — Other Ambulatory Visit: Payer: Self-pay | Admitting: Internal Medicine

## 2014-01-02 ENCOUNTER — Other Ambulatory Visit (INDEPENDENT_AMBULATORY_CARE_PROVIDER_SITE_OTHER): Payer: Medicare HMO

## 2014-01-02 ENCOUNTER — Telehealth: Payer: Self-pay | Admitting: Internal Medicine

## 2014-01-02 ENCOUNTER — Ambulatory Visit (INDEPENDENT_AMBULATORY_CARE_PROVIDER_SITE_OTHER): Payer: Medicare HMO | Admitting: *Deleted

## 2014-01-02 DIAGNOSIS — Z780 Asymptomatic menopausal state: Secondary | ICD-10-CM

## 2014-01-02 DIAGNOSIS — E538 Deficiency of other specified B group vitamins: Secondary | ICD-10-CM

## 2014-01-02 DIAGNOSIS — Z1211 Encounter for screening for malignant neoplasm of colon: Secondary | ICD-10-CM

## 2014-01-02 DIAGNOSIS — Z0000 Encounter for general adult medical examination without abnormal findings: Secondary | ICD-10-CM

## 2014-01-02 LAB — COMPREHENSIVE METABOLIC PANEL
ALBUMIN: 4.1 g/dL (ref 3.5–5.2)
ALT: 23 U/L (ref 0–35)
AST: 18 U/L (ref 0–37)
Alkaline Phosphatase: 78 U/L (ref 39–117)
BUN: 19 mg/dL (ref 6–23)
CO2: 26 mEq/L (ref 19–32)
Calcium: 9 mg/dL (ref 8.4–10.5)
Chloride: 107 mEq/L (ref 96–112)
Creatinine, Ser: 0.6 mg/dL (ref 0.4–1.2)
GFR: 108.24 mL/min (ref >60.00)
GLUCOSE: 98 mg/dL (ref 70–99)
POTASSIUM: 4.2 meq/L (ref 3.5–5.1)
Sodium: 141 mEq/L (ref 135–145)
TOTAL PROTEIN: 6.9 g/dL (ref 6.0–8.3)
Total Bilirubin: 1 mg/dL (ref 0.2–1.2)

## 2014-01-02 LAB — CBC WITH DIFFERENTIAL/PLATELET
BASOS ABS: 0 10*3/uL (ref 0.0–0.1)
Basophils Relative: 0.6 % (ref 0.0–3.0)
Eosinophils Absolute: 0.4 10*3/uL (ref 0.0–0.7)
Eosinophils Relative: 4.3 % (ref 0.0–5.0)
HCT: 41.2 % (ref 36.0–46.0)
Hemoglobin: 13.8 g/dL (ref 12.0–15.0)
LYMPHS ABS: 2.4 10*3/uL (ref 0.7–4.0)
LYMPHS PCT: 28.9 % (ref 12.0–46.0)
MCHC: 33.5 g/dL (ref 30.0–36.0)
MCV: 92.5 fl (ref 78.0–100.0)
MONO ABS: 0.9 10*3/uL (ref 0.1–1.0)
MONOS PCT: 10.1 % (ref 3.0–12.0)
NEUTROS ABS: 4.7 10*3/uL (ref 1.4–7.7)
NEUTROS PCT: 56.1 % (ref 43.0–77.0)
Platelets: 251 10*3/uL (ref 150.0–400.0)
RBC: 4.46 Mil/uL (ref 3.87–5.11)
RDW: 14.7 % (ref 11.5–15.5)
WBC: 8.4 10*3/uL (ref 4.0–10.5)

## 2014-01-02 LAB — LIPID PANEL
CHOLESTEROL: 271 mg/dL — AB (ref 0–200)
HDL: 85.6 mg/dL (ref >39.00)
LDL Cholesterol: 173 mg/dL — ABNORMAL HIGH (ref 0–99)
Total CHOL/HDL Ratio: 3
Triglycerides: 62 mg/dL (ref 0.0–149.0)
VLDL: 12.4 mg/dL (ref 0.0–40.0)

## 2014-01-02 LAB — MICROALBUMIN / CREATININE URINE RATIO
MICROALB/CREAT RATIO: 1 mg/g (ref 0.0–30.0)
Microalb, Ur: 1.4 mg/dL (ref 0.0–1.9)

## 2014-01-02 MED ORDER — CYANOCOBALAMIN 1000 MCG/ML IJ SOLN
1000.0000 ug | Freq: Once | INTRAMUSCULAR | Status: AC
Start: 2014-01-02 — End: 2014-01-02
  Administered 2014-01-02: 1000 ug via INTRAMUSCULAR

## 2014-01-02 NOTE — Telephone Encounter (Signed)
Received fax from [facility] back Auth# [id number] Treating Provider Nehemiah Massed # of Visits 6 Start Date 6.23.15 End Date 12.21.15 CPT 99499 DX 702.19, [id number].[place]

## 2014-01-03 LAB — VITAMIN D 25 HYDROXY (VIT D DEFICIENCY, FRACTURES): VIT D 25 HYDROXY: 43 ng/mL (ref 30–89)

## 2014-01-23 ENCOUNTER — Ambulatory Visit (INDEPENDENT_AMBULATORY_CARE_PROVIDER_SITE_OTHER): Payer: Medicare HMO | Admitting: *Deleted

## 2014-01-23 DIAGNOSIS — E538 Deficiency of other specified B group vitamins: Secondary | ICD-10-CM

## 2014-01-23 MED ORDER — CYANOCOBALAMIN 1000 MCG/ML IJ SOLN
1000.0000 ug | Freq: Once | INTRAMUSCULAR | Status: AC
  Administered 2014-01-23: 1000 ug via INTRAMUSCULAR

## 2014-01-28 ENCOUNTER — Encounter: Payer: Self-pay | Admitting: Internal Medicine

## 2014-01-28 ENCOUNTER — Ambulatory Visit (INDEPENDENT_AMBULATORY_CARE_PROVIDER_SITE_OTHER): Payer: Medicare HMO | Admitting: Internal Medicine

## 2014-01-28 VITALS — BP 146/80 | HR 94 | Temp 97.9°F | Ht 61.0 in | Wt 158.0 lb

## 2014-01-28 DIAGNOSIS — Z0000 Encounter for general adult medical examination without abnormal findings: Secondary | ICD-10-CM

## 2014-01-28 DIAGNOSIS — M069 Rheumatoid arthritis, unspecified: Secondary | ICD-10-CM

## 2014-01-28 DIAGNOSIS — Z1211 Encounter for screening for malignant neoplasm of colon: Secondary | ICD-10-CM | POA: Insufficient documentation

## 2014-01-28 DIAGNOSIS — R252 Cramp and spasm: Secondary | ICD-10-CM | POA: Insufficient documentation

## 2014-01-28 DIAGNOSIS — E7849 Other hyperlipidemia: Secondary | ICD-10-CM

## 2014-01-28 DIAGNOSIS — M25512 Pain in left shoulder: Secondary | ICD-10-CM | POA: Insufficient documentation

## 2014-01-28 DIAGNOSIS — M25519 Pain in unspecified shoulder: Secondary | ICD-10-CM

## 2014-01-28 DIAGNOSIS — E785 Hyperlipidemia, unspecified: Secondary | ICD-10-CM

## 2014-01-28 LAB — HM COLONOSCOPY

## 2014-01-28 NOTE — Patient Instructions (Signed)
Congratulations on quitting smoking! Let us know if you need anything.  We will set up evaluation with vascular for ultrasound of your legs to evaluate cramping.  We will also set up a sports medicine evaluation with Dr. Lorelei Pont for your left shoulder.  Follow up in 4 weeks.

## 2014-01-28 NOTE — Progress Notes (Signed)
Pre visit review using our clinic review tool, if applicable. No additional management support is needed unless otherwise documented below in the visit note. 

## 2014-01-28 NOTE — Progress Notes (Signed)
Subjective:    Patient ID: Wanda Cervantes, female    DOB: 10-Jul-1951, 63 y.o.   MRN: [id number]  HPI 62YO female presents for annual exam.  RA - Went to see rheumatologist. Discussing options for medications for RA. Has follow up scheduled in end of July. Continues to use only prn Naprosyn for pain with minimal improvement. Continues to have left shoulder pain and difficulty abducting left arm. Requested steroid injection left shoulder with rheumatologist, but she reportedly declined.  Continues to have cramping in back, sides, legs. Cramping is worse when extremities are cold.  Review of Systems  Constitutional: Negative for fever, chills, appetite change, fatigue and unexpected weight change.  HENT: Negative for congestion, ear pain, sinus pressure, sore throat, trouble swallowing and voice change.   Eyes: Negative for visual disturbance.  Respiratory: Negative for cough, shortness of breath, wheezing and stridor.   Cardiovascular: Negative for chest pain, palpitations and leg swelling.  Gastrointestinal: Negative for nausea, vomiting, abdominal pain, diarrhea, constipation, blood in stool, abdominal distention and anal bleeding.  Genitourinary: Negative for dysuria and flank pain.  Musculoskeletal: Positive for myalgias. Negative for arthralgias, gait problem and neck pain.  Skin: Negative for color change and rash.  Neurological: Negative for dizziness and headaches.  Hematological: Negative for adenopathy. Does not bruise/bleed easily.  Psychiatric/Behavioral: Negative for suicidal ideas, sleep disturbance and dysphoric mood. The patient is not nervous/anxious.        Objective:    BP 146/80  Pulse 94  Temp(Src) 97.9 F (36.6 C) (Oral)  Ht 5\' 1"  (1.549 m)  Wt 158 lb (71.668 kg)  BMI 29.87 kg/m2  SpO2 96% Physical Exam  Constitutional: She is oriented to person, place, and time. She appears well-developed and well-nourished. No distress.  HENT:  Head: Normocephalic  and atraumatic.  Right Ear: External ear normal.  Left Ear: External ear normal.  Nose: Nose normal.  Mouth/Throat: Oropharynx is clear and moist. No oropharyngeal exudate.  Eyes: Conjunctivae are normal. Pupils are equal, round, and reactive to light. Right eye exhibits no discharge. Left eye exhibits no discharge. No scleral icterus.  Neck: Normal range of motion. Neck supple. No tracheal deviation present. No thyromegaly present.  Cardiovascular: Normal rate, regular rhythm, normal heart sounds and intact distal pulses.  Exam reveals no gallop and no friction rub.   No murmur heard. Pulmonary/Chest: Effort normal and breath sounds normal. No accessory muscle usage. Not tachypneic. No respiratory distress. She has no decreased breath sounds. She has no wheezes. She has no rales. She exhibits no tenderness. Right breast exhibits no inverted nipple, no mass, no nipple discharge, no skin change and no tenderness. Left breast exhibits no inverted nipple, no mass, no nipple discharge, no skin change and no tenderness. Breasts are symmetrical.  Abdominal: Soft. Bowel sounds are normal. She exhibits no distension and no mass. There is no tenderness. There is no rebound and no guarding.  Musculoskeletal: She exhibits no edema.       Left shoulder: She exhibits decreased range of motion, tenderness, pain and decreased strength (abduction). She exhibits no bony tenderness.  Lymphadenopathy:    She has no cervical adenopathy.  Neurological: She is alert and oriented to person, place, and time. No cranial nerve deficit. She exhibits normal muscle tone. Coordination normal.  Skin: Skin is warm and dry. No rash noted. She is not diaphoretic. No erythema. No pallor.  Psychiatric: She has a normal mood and affect. Her behavior is normal. Judgment and thought content normal.  Assessment & Plan:   Problem List Items Addressed This Visit     Unprioritized   Familial hyperlipidemia      Lab  Results  Component Value Date   LDLCALC 173* 01/02/2014   Discussed elevated lipids, c/w familial hyperlipidemia. Pt has been unable to tolerate statin medications. Recommended Mediterranean style diet and exercise 5min 3x per week.    Left shoulder pain     Persistent pain left shoulder with decreased ROM. Suspect RA contributing. Will set up sports med evaluation. Question if steroid injection might be helpful.    Relevant Orders      Ambulatory referral to Sports Medicine   Muscle cramp     Pt complains of diffuse muscle cramping. Recent electrolytes normal. Unclear etiology of symptoms. She will continue prn OTC Magnesium supplement. Will also set up vascular evaluation of LE given question of claudication given her h/o hyperlipidemia and tobacco abuse.    Relevant Orders      Ambulatory referral to Vascular Surgery   Rheumatoid arthritis     Will forward recent labs to her rheumatologist. Will request notes from rheumatology.    Routine general medical examination at a health care facility - Primary     General medical exam including breast exam normal today except as noted. PAP normal 2013, HPV neg, plan repeat 2016. Mammogram UTD. Colonoscopy pending. Immunizations UTD. Reviewed recent labs. Congratulated pt on smoking cessation. Encouraged healthy diet and exercise.    Screening for colon cancer       Return in about 4 weeks (around 02/25/2014) for Follow up cramping.

## 2014-01-28 NOTE — Assessment & Plan Note (Addendum)
Persistent pain left shoulder with decreased ROM. Suspect RA contributing. Will set up sports med evaluation. Question if steroid injection might be helpful.

## 2014-01-28 NOTE — Assessment & Plan Note (Addendum)
General medical exam including breast exam normal today except as noted. PAP normal 2013, HPV neg, plan repeat 2016. Mammogram UTD. Colonoscopy pending. Immunizations UTD. Reviewed recent labs. Congratulated pt on smoking cessation. Encouraged healthy diet and exercise.

## 2014-01-28 NOTE — Assessment & Plan Note (Signed)
Lab Results  Component Value Date   LDLCALC 173* 01/02/2014   Discussed elevated lipids, c/w familial hyperlipidemia. Pt has been unable to tolerate statin medications. Recommended Mediterranean style diet and exercise 30min 3x per week.

## 2014-01-28 NOTE — Assessment & Plan Note (Signed)
Will forward recent labs to her rheumatologist. Will request notes from rheumatology.

## 2014-01-28 NOTE — Assessment & Plan Note (Signed)
Pt complains of diffuse muscle cramping. Recent electrolytes normal. Unclear etiology of symptoms. She will continue prn OTC Magnesium supplement. Will also set up vascular evaluation of LE given question of claudication given her h/o hyperlipidemia and tobacco abuse.

## 2014-01-30 ENCOUNTER — Ambulatory Visit (INDEPENDENT_AMBULATORY_CARE_PROVIDER_SITE_OTHER): Payer: Medicare HMO | Admitting: Family Medicine

## 2014-01-30 ENCOUNTER — Encounter: Payer: Self-pay | Admitting: Family Medicine

## 2014-01-30 VITALS — BP 138/80 | HR 92 | Temp 98.1°F | Ht 61.5 in | Wt 158.0 lb

## 2014-01-30 DIAGNOSIS — M25512 Pain in left shoulder: Secondary | ICD-10-CM

## 2014-01-30 DIAGNOSIS — M069 Rheumatoid arthritis, unspecified: Secondary | ICD-10-CM

## 2014-01-30 DIAGNOSIS — M25519 Pain in unspecified shoulder: Secondary | ICD-10-CM

## 2014-01-30 NOTE — Progress Notes (Signed)
Pre visit review using our clinic review tool, if applicable. No additional management support is needed unless otherwise documented below in the visit note. 

## 2014-01-30 NOTE — Progress Notes (Signed)
[place] [address] Phone: [phone] Fax: [phone]  Patient ID: Wanda Cervantes MRN: [id number], DOB: [birth year], 63 y.o. Date of Encounter: 01/30/2014  Primary Physician:  Rica Mast, MD   Chief Complaint: Shoulder Pain   Subjective:   History of Present Illness:  Wanda Cervantes is a 63 y.o. very pleasant female patient who presents with the following:  Dr. Gilford Rile asked me to see this nice patient with longstanding RA. 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 Lenna Gilford.   Shoulder pain. 4 months, and left arm really hurts a lot. Has two dogs and both have been sick and she got to the point that she could not walk. Heavy toy poodle. She is not currently on any DMARD's, and she is taking some Aleve. Her LEFT shoulder has really gotten to be quite painful, and it is painful with flexion, abduction, and some rotational movements. She has not had any trauma or injury that she can recall.  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   Past Medical History, Surgical History, Social History, Family History, Problem List, Medications, and Allergies have been reviewed and updated if relevant.  Review of Systems:  GEN: No fevers, chills. Nontoxic. Primarily MSK c/o today. MSK: Detailed in the HPI GI: tolerating PO intake without difficulty Neuro: No numbness, parasthesias, or tingling associated. Otherwise the pertinent positives of the ROS are noted above.   Objective:   Physical Examination: BP 138/80  Pulse 92  Temp(Src) 98.1 F (36.7 C) (Oral)  Ht 5' 1.5" (1.562 m)  Wt 158 lb (71.668 kg)  BMI 29.37 kg/m2  SpO2 98%   GEN: WDWN, NAD, Non-toxic, Alert & Oriented x 3 HEENT: Atraumatic, Normocephalic.  Ears and Nose: No external deformity. EXTR: No clubbing/cyanosis/edema NEURO: Normal gait.  PSYCH:  Normally interactive. Conversant. Not depressed or anxious appearing.  Calm demeanor.    L shoulder: nontender along the clavicle. The patient is mildly tender at the acromioclavicular joint. She is nontender in the bicipital groove. She has full range of motion in abduction, flexion, internal and external range of motion, but she is stiff, and move slowly.  Strength testing is 5/5 in all directions.  Speeds and Yergason's testing is negative.  Neer testing and Hawkins testing is done on more than one occasion, and it appears to be negative on all occasions. She also has no tenderness in the supraspinatus insertion or at the subacromial bursa when it is exposed.  Sulcus sign is negative.  Radiology: No results found.  Assessment & Plan:   Left shoulder pain  Rheumatoid arthritis  I think that this is truly more of a pure RA exacerbation. I cannot make her rotator cuff impinge or cause pain in any of her shoulder bursa. She is going to discuss potential systemic treatments with her rheumatologist. I think that that would be reasonable.  Acutely, I think it is reasonable to do an intra-articular injection with steroids. Also showed her some basic motion exercises to do daily.  Intrarticular Shoulder Injection, LEFT Verbal consent was obtained from the patient. Risks including infection explained and contrasted with benefits and alternatives. Patient prepped with Chloraprep and Ethyl Chloride used for anesthesia. An intraarticular shoulder injection was performed using the posterior approach. The patient tolerated the procedure well and had decreased pain post injection. No complications. Injection: 8 cc of Lidocaine 1%  and Depo-Medrol 80 mg. Needle: 22 gauge   I appreciate the opportunity to evaluate this very friendly patient. If you have any question regarding her care or prognosis, do not hesitate to ask.   Follow-up: PRN Unless noted above, the patient is to follow-up if symptoms  worsen. Red flags were reviewed with the patient.  Signed,  Maud Deed. Yolanda Dockendorf, MD, CAQ Sports Medicine   Discontinued Medications   No medications on file   Current Medications at Discharge:   Medication List       This list is accurate as of: 01/30/14 11:59 PM.  Always use your most recent med list.               cyanocobalamin 1000 MCG/ML injection  Commonly known as:  (VITAMIN B-12)  Inject 1,000 mcg into the muscle once.     KRILL OIL PO  Take by mouth.     MAGNESIUM CITRATE PO  Take by mouth.     naproxen sodium 220 MG tablet  Commonly known as:  ANAPROX  Take 220 mg by mouth as needed.

## 2014-02-17 ENCOUNTER — Ambulatory Visit: Payer: Medicare HMO

## 2014-02-18 ENCOUNTER — Ambulatory Visit (INDEPENDENT_AMBULATORY_CARE_PROVIDER_SITE_OTHER): Payer: Medicare HMO | Admitting: *Deleted

## 2014-02-18 DIAGNOSIS — E538 Deficiency of other specified B group vitamins: Secondary | ICD-10-CM

## 2014-02-18 MED ORDER — CYANOCOBALAMIN 1000 MCG/ML IJ SOLN
1000.0000 ug | Freq: Once | INTRAMUSCULAR | Status: AC
  Administered 2014-02-18: 1000 ug via INTRAMUSCULAR

## 2014-03-12 ENCOUNTER — Ambulatory Visit: Payer: Medicare HMO

## 2014-03-25 ENCOUNTER — Ambulatory Visit (INDEPENDENT_AMBULATORY_CARE_PROVIDER_SITE_OTHER): Payer: Medicare HMO | Admitting: *Deleted

## 2014-03-25 DIAGNOSIS — E538 Deficiency of other specified B group vitamins: Secondary | ICD-10-CM

## 2014-03-25 MED ORDER — CYANOCOBALAMIN 1000 MCG/ML IJ SOLN
1000.0000 ug | Freq: Once | INTRAMUSCULAR | Status: AC
  Administered 2014-03-25: 1000 ug via INTRAMUSCULAR

## 2014-04-06 ENCOUNTER — Ambulatory Visit: Payer: Self-pay | Admitting: Internal Medicine

## 2014-04-06 ENCOUNTER — Telehealth: Payer: Self-pay | Admitting: Internal Medicine

## 2014-04-06 ENCOUNTER — Encounter: Payer: Self-pay | Admitting: Internal Medicine

## 2014-04-06 ENCOUNTER — Ambulatory Visit (INDEPENDENT_AMBULATORY_CARE_PROVIDER_SITE_OTHER): Payer: Medicare HMO | Admitting: Internal Medicine

## 2014-04-06 VITALS — BP 140/80 | HR 96 | Temp 97.9°F | Ht 61.5 in | Wt 159.0 lb

## 2014-04-06 DIAGNOSIS — R109 Unspecified abdominal pain: Secondary | ICD-10-CM

## 2014-04-06 DIAGNOSIS — R350 Frequency of micturition: Secondary | ICD-10-CM

## 2014-04-06 DIAGNOSIS — R52 Pain, unspecified: Secondary | ICD-10-CM

## 2014-04-06 LAB — POCT URINALYSIS DIPSTICK
Bilirubin, UA: NEGATIVE
Glucose, UA: NEGATIVE
KETONES UA: NEGATIVE
LEUKOCYTES UA: NEGATIVE
Nitrite, UA: NEGATIVE
PH UA: 7
Protein, UA: NEGATIVE
RBC UA: NEGATIVE
Urobilinogen, UA: 0.2

## 2014-04-06 MED ORDER — HYDROCODONE-ACETAMINOPHEN 5-325 MG PO TABS: 1.0000 | ORAL_TABLET | Freq: Three times a day (TID) | ORAL | Status: DC | PRN

## 2014-04-06 NOTE — Addendum Note (Signed)
Addended by: Karlene Einstein D on: 04/06/2014 02:42 PM   Modules accepted: Orders

## 2014-04-06 NOTE — Patient Instructions (Signed)
CT of the abdomen today to look for kidney stone.  Please increase fluid intake. Start Hydrocodone as needed for severe pain.

## 2014-04-06 NOTE — Telephone Encounter (Signed)
Please schedule her for 1pm on August 27 for 55min

## 2014-04-06 NOTE — Progress Notes (Signed)
   Subjective:    Patient ID: Wanda Cervantes, female    DOB: [birth year], 63 y.o.   MRN: [id number]  HPI 62YO female presents for acute visit.  Flank pain - 4 days ago developed right flank pain. No blood in urine. No dysuria. Urinating frequently with urgency. Taking Naprosyn with minimal improvement. Pain severe at times. Unable to sleep. No fever.  Review of Systems  Constitutional: Negative for fever, chills and fatigue.  Gastrointestinal: Negative for nausea, vomiting, abdominal pain, diarrhea, constipation and rectal pain.  Genitourinary: Positive for urgency, frequency and flank pain. Negative for dysuria, hematuria, decreased urine volume, vaginal bleeding, vaginal discharge, difficulty urinating, vaginal pain and pelvic pain.  Psychiatric/Behavioral: Positive for sleep disturbance.       Objective:    BP 140/80  Pulse 96  Temp(Src) 97.9 F (36.6 C) (Oral)  Ht 5' 1.5" (1.562 m)  Wt 159 lb (72.122 kg)  BMI 29.56 kg/m2  SpO2 96% Physical Exam  Constitutional: She is oriented to person, place, and time. She appears well-developed and well-nourished. No distress.  HENT:  Head: Normocephalic and atraumatic.  Right Ear: External ear normal.  Left Ear: External ear normal.  Nose: Nose normal.  Mouth/Throat: Oropharynx is clear and moist.  Eyes: Conjunctivae are normal. Pupils are equal, round, and reactive to light. Right eye exhibits no discharge. Left eye exhibits no discharge. No scleral icterus.  Neck: Normal range of motion. Neck supple. No tracheal deviation present. No thyromegaly present.  Cardiovascular: Normal rate, regular rhythm, normal heart sounds and intact distal pulses.  Exam reveals no gallop and no friction rub.   No murmur heard. Pulmonary/Chest: Effort normal and breath sounds normal. No accessory muscle usage. Not tachypneic. No respiratory distress. She has no decreased breath sounds. She has no wheezes. She has no rhonchi. She has no rales. She  exhibits no tenderness.  Abdominal: Soft. Bowel sounds are normal. She exhibits no distension and no mass. There is tenderness (right flank). There is no rebound and no guarding.  Musculoskeletal: Normal range of motion. She exhibits no edema and no tenderness.  Lymphadenopathy:    She has no cervical adenopathy.  Neurological: She is alert and oriented to person, place, and time. No cranial nerve deficit. She exhibits normal muscle tone. Coordination normal.  Skin: Skin is warm and dry. No rash noted. She is not diaphoretic. No erythema. No pallor.  Psychiatric: She has a normal mood and affect. Her behavior is normal. Judgment and thought content normal.          Assessment & Plan:   Problem List Items Addressed This Visit     Unprioritized   Acute flank pain - Primary     Severe right flank pain most consistent with nephrolithiasis, however UA neg for blood. Will get CT abdomen for evaluation. Hydrocodone prn severe pain. Increase fluid intake.    Relevant Medications      HYDROcodone-acetaminophen (NORCO/VICODIN) 5-325 MG per tablet   Other Relevant Orders      CT Abdomen Pelvis Wo Contrast    Other Visit Diagnoses   Frequency of urination        Relevant Orders       POCT Urinalysis Dipstick (Completed)        Return if symptoms worsen or fail to improve.

## 2014-04-06 NOTE — Telephone Encounter (Signed)
CT of the abdomen showed no abnormalities to explain right flank pain. I would like for pt to continue increased fluids and Hydrocodone as needed for severe pain. We should see her back later this week for recheck, or sooner as needed. Please also send her urine for culture.

## 2014-04-06 NOTE — Telephone Encounter (Signed)
Notified pt, transferred her scheduling for appt

## 2014-04-06 NOTE — Assessment & Plan Note (Signed)
Severe right flank pain most consistent with nephrolithiasis, however UA neg for blood. Will get CT abdomen for evaluation. Hydrocodone prn severe pain. Increase fluid intake.

## 2014-04-06 NOTE — Progress Notes (Signed)
Pre visit review using our clinic review tool, if applicable. No additional management support is needed unless otherwise documented below in the visit note. 

## 2014-04-06 NOTE — Telephone Encounter (Signed)
Pt called in and stated per Dr. Gilford Rile needed to be seen within a week. Would like to have an appt around the 27th if possible. Please advise.

## 2014-04-07 NOTE — Telephone Encounter (Signed)
Lorriane Shire,  I have not had a chance to call and ask her if this appt was okay, could you call and add her to schedule if she can. Thank you!

## 2014-04-08 ENCOUNTER — Other Ambulatory Visit: Payer: Self-pay | Admitting: *Deleted

## 2014-04-08 MED ORDER — CIPROFLOXACIN HCL 500 MG PO TABS: 500.0000 mg | ORAL_TABLET | Freq: Two times a day (BID) | ORAL | Status: DC

## 2014-04-08 NOTE — Telephone Encounter (Signed)
Left patient a message on her home phone to confirm her appointment.

## 2014-04-09 LAB — CULTURE, URINE COMPREHENSIVE: Colony Count: >=100000

## 2014-04-10 NOTE — Telephone Encounter (Signed)
Patient confirmed her appointment

## 2014-04-13 ENCOUNTER — Telehealth: Payer: Self-pay | Admitting: *Deleted

## 2014-04-13 NOTE — Telephone Encounter (Signed)
She should STOP the Cipro. We had asked her to stop by and submit another urine sample, as she did not want to take any other antibiotics for recent UTI. We could look at blister then. If she has ANY shortness of breath or hives, then she needs to be seen more urgently in the ED.

## 2014-04-13 NOTE — Telephone Encounter (Signed)
Left message, notifying patient. 

## 2014-04-13 NOTE — Telephone Encounter (Signed)
Error

## 2014-04-13 NOTE — Telephone Encounter (Signed)
Pt left message stating she developed a blister on her right leg while taking the Cipro.  Pt states she is very concerned and is requesting a return call.  Please advise

## 2014-04-13 NOTE — Telephone Encounter (Signed)
Pt left VM, pt started Cipro on Friday, complaining of right arm and right leg tremors on saturday x 30 minutes. Had continued right leg cramping all day yesterday. Stopped the Cipro on Saturday, has not taken since. Does not want to start on another antibiotic yet due to these side effects.

## 2014-04-13 NOTE — Telephone Encounter (Signed)
OK. If she does not want to take another antibiotic, we could recheck a UA to make sure her infection has cleared.

## 2014-04-13 NOTE — Telephone Encounter (Signed)
Pt states she has called her pharmacist and he has advised her to take Benadryl and apply Hydrocortisone cream to the area.  She states she has done that; she denies shortness of breath.  She does not want to be seen until tomorrow.

## 2014-04-14 ENCOUNTER — Other Ambulatory Visit (INDEPENDENT_AMBULATORY_CARE_PROVIDER_SITE_OTHER): Payer: Medicare HMO

## 2014-04-14 DIAGNOSIS — R350 Frequency of micturition: Secondary | ICD-10-CM

## 2014-04-14 DIAGNOSIS — Z139 Encounter for screening, unspecified: Secondary | ICD-10-CM

## 2014-04-14 LAB — URINALYSIS, ROUTINE W REFLEX MICROSCOPIC
Bilirubin Urine: NEGATIVE
Hgb urine dipstick: NEGATIVE
Ketones, ur: NEGATIVE
Leukocytes, UA: NEGATIVE
Nitrite: NEGATIVE
PH: 6 (ref 5.0–8.0)
RBC / HPF: NONE SEEN (ref 0–?)
SPECIFIC GRAVITY, URINE: 1.015 (ref 1.000–1.030)
TOTAL PROTEIN, URINE-UPE24: NEGATIVE
Urine Glucose: NEGATIVE
Urobilinogen, UA: 0.2 (ref 0.0–1.0)
WBC UA: NONE SEEN (ref 0–?)

## 2014-04-15 ENCOUNTER — Encounter: Payer: Self-pay | Admitting: Internal Medicine

## 2014-04-16 ENCOUNTER — Ambulatory Visit: Payer: Medicare HMO | Admitting: Internal Medicine

## 2014-04-28 ENCOUNTER — Ambulatory Visit (INDEPENDENT_AMBULATORY_CARE_PROVIDER_SITE_OTHER): Payer: Medicare HMO | Admitting: *Deleted

## 2014-04-28 DIAGNOSIS — Z23 Encounter for immunization: Secondary | ICD-10-CM

## 2014-04-28 DIAGNOSIS — E538 Deficiency of other specified B group vitamins: Secondary | ICD-10-CM

## 2014-04-28 MED ORDER — CYANOCOBALAMIN 1000 MCG/ML IJ SOLN
1000.0000 ug | Freq: Once | INTRAMUSCULAR | Status: AC
Start: 2014-04-28 — End: 2014-04-28
  Administered 2014-04-28: 1000 ug via INTRAMUSCULAR

## 2014-05-25 ENCOUNTER — Telehealth: Payer: Self-pay | Admitting: *Deleted

## 2014-05-25 NOTE — Telephone Encounter (Signed)
No. She should wait to get the Prevnar at age 63.

## 2014-05-25 NOTE — Telephone Encounter (Signed)
Pt called asking when she got her pneumonia vaccination last, she received pneumonoccal polysaccharide -23 on 07/03/12.  Pt is asking if she needs another one.

## 2014-05-27 NOTE — Telephone Encounter (Signed)
Notified pt. 

## 2014-06-05 ENCOUNTER — Telehealth: Payer: Self-pay | Admitting: Internal Medicine

## 2014-06-05 ENCOUNTER — Other Ambulatory Visit: Payer: Self-pay | Admitting: Internal Medicine

## 2014-06-05 DIAGNOSIS — Z1239 Encounter for other screening for malignant neoplasm of breast: Secondary | ICD-10-CM

## 2014-06-05 NOTE — Telephone Encounter (Signed)
Ms. Marsland called asking that a referral be sent for her to have a Screening Bilateral Mammogram. She didn't mention the exact location but it would be the same place she's been to before. Please call the patient if needed. Pt ph# [phone] Thank you.

## 2014-07-02 ENCOUNTER — Telehealth: Payer: Self-pay | Admitting: *Deleted

## 2014-07-02 NOTE — Telephone Encounter (Signed)
Offered pt appt on dec 2nd but she said that was not good enough, she needs to be seen sooner pt states that her hands are really hurting her

## 2014-07-02 NOTE — Telephone Encounter (Signed)
Monday at 3:30pm 7min

## 2014-07-02 NOTE — Telephone Encounter (Signed)
Can we get the notes from her rheumatologist? We should make a 62min follow up appointment.

## 2014-07-02 NOTE — Telephone Encounter (Signed)
Pt left message stating that rheumatologist was giving her injections for pain in hands and shoulder but now refuses to give the injections anymore, wants to know if we can give the injections or send her somewhere that will.  Also asking if Celebrex would be a good medication for her.

## 2014-07-03 NOTE — Telephone Encounter (Signed)
Could you please schedule

## 2014-07-06 ENCOUNTER — Encounter: Payer: Self-pay | Admitting: Internal Medicine

## 2014-07-06 ENCOUNTER — Ambulatory Visit (INDEPENDENT_AMBULATORY_CARE_PROVIDER_SITE_OTHER): Payer: Medicare HMO | Admitting: Internal Medicine

## 2014-07-06 VITALS — BP 140/84 | HR 101 | Temp 98.7°F | Ht 61.5 in | Wt 158.5 lb

## 2014-07-06 DIAGNOSIS — E538 Deficiency of other specified B group vitamins: Secondary | ICD-10-CM

## 2014-07-06 DIAGNOSIS — Z634 Disappearance and death of family member: Secondary | ICD-10-CM

## 2014-07-06 DIAGNOSIS — M069 Rheumatoid arthritis, unspecified: Secondary | ICD-10-CM

## 2014-07-06 MED ORDER — PREDNISONE 10 MG PO TABS: ORAL_TABLET | ORAL | Status: DC

## 2014-07-06 MED ORDER — CYANOCOBALAMIN 1000 MCG/ML IJ SOLN
1000.0000 ug | Freq: Once | INTRAMUSCULAR | Status: AC
  Administered 2014-07-06: 1000 ug via INTRAMUSCULAR

## 2014-07-06 NOTE — Assessment & Plan Note (Signed)
Offered support today given recent death of her son.

## 2014-07-06 NOTE — Assessment & Plan Note (Signed)
Symptoms and exam consistent with RA flare. Will start prednisone taper. Encouraged her to consider DMARDS and discussed high risks of long term prednisone use. Will set up second opinion with rheumatology.

## 2014-07-06 NOTE — Progress Notes (Signed)
Subjective:    Patient ID: Wanda Cervantes, female    DOB: [birth year], 63 y.o.   MRN: [id number]  HPI 62YO female presents for follow up.  RA - Recently seen by rheumatologist, Dr. Trudie Reed for worsening joint pain and swelling. Requested repeat steroid injections. Rheumatologist refused. PT has been reluctant to try DMARDS because of side effects. Having swelling in joints of hands. Unable to grasp because of pain. Taking prn Aleve with minimal improvement.  Tearful today describing death of her son last month. He had MR and developed pneumonia. Has support from her family.  Review of Systems  Constitutional: Negative for fever, chills, appetite change, fatigue and unexpected weight change.  Eyes: Negative for visual disturbance.  Respiratory: Negative for shortness of breath.   Cardiovascular: Negative for chest pain and leg swelling.  Gastrointestinal: Negative for nausea, vomiting, abdominal pain, diarrhea and constipation.  Musculoskeletal: Positive for myalgias, joint swelling and arthralgias. Negative for neck stiffness.  Skin: Negative for color change and rash.  Hematological: Negative for adenopathy. Does not bruise/bleed easily.  Psychiatric/Behavioral: Negative for dysphoric mood. The patient is not nervous/anxious.        Objective:    BP 140/84 mmHg  Pulse 101  Temp(Src) 98.7 F (37.1 C) (Oral)  Ht 5' 1.5" (1.562 m)  Wt 158 lb 8 oz (71.895 kg)  BMI 29.47 kg/m2  SpO2 95% Physical Exam  Constitutional: She is oriented to person, place, and time. She appears well-developed and well-nourished. No distress.  HENT:  Head: Normocephalic and atraumatic.  Right Ear: External ear normal.  Left Ear: External ear normal.  Nose: Nose normal.  Mouth/Throat: Oropharynx is clear and moist. No oropharyngeal exudate.  Eyes: Conjunctivae are normal. Pupils are equal, round, and reactive to light. Right eye exhibits no discharge. Left eye exhibits no discharge. No scleral  icterus.  Neck: Normal range of motion. Neck supple. No tracheal deviation present. No thyromegaly present.  Cardiovascular: Normal rate, regular rhythm, normal heart sounds and intact distal pulses.  Exam reveals no gallop and no friction rub.   No murmur heard. Pulmonary/Chest: Effort normal and breath sounds normal. No accessory muscle usage. No tachypnea. No respiratory distress. She has no decreased breath sounds. She has no wheezes. She has no rhonchi. She has no rales. She exhibits no tenderness.  Musculoskeletal: She exhibits no edema.       Right hand: She exhibits decreased range of motion, tenderness and swelling.       Left hand: She exhibits decreased range of motion, tenderness and swelling.  Lymphadenopathy:    She has no cervical adenopathy.  Neurological: She is alert and oriented to person, place, and time. No cranial nerve deficit. She exhibits normal muscle tone. Coordination normal.  Skin: Skin is warm and dry. No rash noted. She is not diaphoretic. No erythema. No pallor.  Psychiatric: Her speech is normal and behavior is normal. Judgment and thought content normal. She exhibits a depressed mood. She expresses no suicidal ideation.          Assessment & Plan:   Problem List Items Addressed This Visit      Unprioritized   Bereavement    Offered support today given recent death of her son.    Rheumatoid arthritis flare - Primary    Symptoms and exam consistent with RA flare. Will start prednisone taper. Encouraged her to consider DMARDS and discussed high risks of long term prednisone use. Will set up second opinion with rheumatology.  Relevant Medications      predniSONE (DELTASONE) tablet   Other Relevant Orders      Ambulatory referral to Rheumatology    Other Visit Diagnoses    B12 deficiency        Relevant Medications       cyanocobalamin ((VITAMIN B-12)) injection 1,000 mcg (Completed) (Start on 07/06/2014  4:15 PM)        Return in about 4  weeks (around 08/03/2014) for Recheck.

## 2014-07-06 NOTE — Patient Instructions (Addendum)
Start Prednisone taper for flare of arthritis.  Email or call as you get toward the end of the steroid taper.  We will set up an evaluation with rheumatology.

## 2014-07-06 NOTE — Progress Notes (Signed)
Pre visit review using our clinic review tool, if applicable. No additional management support is needed unless otherwise documented below in the visit note. 

## 2014-07-10 ENCOUNTER — Telehealth: Payer: Self-pay | Admitting: Internal Medicine

## 2014-07-10 NOTE — Telephone Encounter (Signed)
Please see below.

## 2014-07-10 NOTE — Telephone Encounter (Signed)
The patients hands are doing a lot better. The patient is changing her insurance January 1st . She is wanting to be referred to Dr. Dossie Der for Rheumatology.

## 2014-07-10 NOTE — Telephone Encounter (Signed)
Bonnita Nasuti, Can you change her rheumatology referral to Dr. Dossie Der?

## 2014-07-20 ENCOUNTER — Ambulatory Visit: Payer: Self-pay | Admitting: Internal Medicine

## 2014-07-20 LAB — HM MAMMOGRAPHY: HM Mammogram: NEGATIVE

## 2014-07-21 ENCOUNTER — Encounter: Payer: Self-pay | Admitting: *Deleted

## 2014-07-27 ENCOUNTER — Ambulatory Visit: Payer: Medicare HMO | Admitting: Internal Medicine

## 2014-07-28 ENCOUNTER — Encounter: Payer: Self-pay | Admitting: Internal Medicine

## 2014-08-10 NOTE — Telephone Encounter (Signed)
Is Dr Dossie Der in [place] , have not referred to that Provider

## 2014-08-10 NOTE — Telephone Encounter (Signed)
She is in [place] now I think.

## 2014-08-21 HISTORY — PX: CARPAL TUNNEL RELEASE: SHX101

## 2014-09-17 ENCOUNTER — Telehealth: Payer: Self-pay | Admitting: Internal Medicine

## 2014-09-17 NOTE — Telephone Encounter (Signed)
The patient is needing TB gold , hepatitis panel ,and lipid panel drawn at our office for Dr. Dossie Der. She has an order .Please advise

## 2014-09-17 NOTE — Telephone Encounter (Signed)
That is fine 

## 2014-09-22 ENCOUNTER — Ambulatory Visit (INDEPENDENT_AMBULATORY_CARE_PROVIDER_SITE_OTHER): Payer: Medicare HMO | Admitting: *Deleted

## 2014-09-22 DIAGNOSIS — E538 Deficiency of other specified B group vitamins: Secondary | ICD-10-CM

## 2014-09-22 MED ORDER — CYANOCOBALAMIN 1000 MCG/ML IJ SOLN
1000.0000 ug | Freq: Once | INTRAMUSCULAR | Status: AC
  Administered 2014-09-22: 1000 ug via INTRAMUSCULAR

## 2014-09-24 ENCOUNTER — Telehealth: Payer: Self-pay | Admitting: *Deleted

## 2014-09-24 ENCOUNTER — Other Ambulatory Visit (INDEPENDENT_AMBULATORY_CARE_PROVIDER_SITE_OTHER): Payer: Medicare HMO

## 2014-09-24 DIAGNOSIS — M069 Rheumatoid arthritis, unspecified: Secondary | ICD-10-CM

## 2014-09-24 DIAGNOSIS — Z79899 Other long term (current) drug therapy: Secondary | ICD-10-CM

## 2014-09-24 LAB — LIPID PANEL
CHOL/HDL RATIO: 3
CHOLESTEROL: 290 mg/dL — AB (ref 0–200)
HDL: 93.7 mg/dL (ref >39.00)
LDL CALC: 162 mg/dL — AB (ref 0–99)
NonHDL: 196.3
Triglycerides: 173 mg/dL — ABNORMAL HIGH (ref 0.0–149.0)
VLDL: 34.6 mg/dL (ref 0.0–40.0)

## 2014-09-24 LAB — HEPATITIS PANEL, ACUTE
HCV Ab: NEGATIVE
HEP A IGM: NONREACTIVE
HEP B C IGM: NONREACTIVE
Hepatitis B Surface Ag: NEGATIVE

## 2014-09-24 NOTE — Telephone Encounter (Signed)
This must be for the TB gold , hepatitis panel ,and lipid panel that was requested from Dr. Dossie Der.

## 2014-09-24 NOTE — Telephone Encounter (Signed)
Rheumatoid arthritis

## 2014-09-24 NOTE — Telephone Encounter (Signed)
What dx?

## 2014-09-24 NOTE — Telephone Encounter (Signed)
Labs and dx?  

## 2014-09-25 ENCOUNTER — Encounter: Payer: Self-pay | Admitting: *Deleted

## 2014-09-28 LAB — QUANTIFERON TB GOLD ASSAY (BLOOD)
Mitogen value: 0.07 IU/mL
QUANTIFERON NIL VALUE: 0.03 [IU]/mL
Quantiferon Tb Ag Minus Nil Value: 0 IU/mL
TB Ag value: 0.03 IU/mL

## 2014-09-30 ENCOUNTER — Telehealth: Payer: Self-pay | Admitting: *Deleted

## 2014-09-30 NOTE — Telephone Encounter (Signed)
Pt is coming in tomorrow what labs and dX? 

## 2014-10-01 ENCOUNTER — Other Ambulatory Visit: Payer: Medicare HMO

## 2014-10-01 NOTE — Telephone Encounter (Signed)
Repeat quantiferon gold

## 2014-10-02 ENCOUNTER — Other Ambulatory Visit (INDEPENDENT_AMBULATORY_CARE_PROVIDER_SITE_OTHER): Payer: Medicare HMO

## 2014-10-02 DIAGNOSIS — Z139 Encounter for screening, unspecified: Secondary | ICD-10-CM

## 2014-10-02 DIAGNOSIS — M069 Rheumatoid arthritis, unspecified: Secondary | ICD-10-CM

## 2014-10-05 LAB — QUANTIFERON TB GOLD ASSAY (BLOOD)
Interferon Gamma Release Assay: NEGATIVE
MITOGEN VALUE: 1.26 [IU]/mL
QUANTIFERON NIL VALUE: 0.03 [IU]/mL
Quantiferon Tb Ag Minus Nil Value: 0.01 IU/mL
TB AG VALUE: 0.04 [IU]/mL

## 2014-12-15 ENCOUNTER — Ambulatory Visit: Payer: Commercial Managed Care - HMO

## 2014-12-23 ENCOUNTER — Telehealth: Payer: Self-pay

## 2014-12-23 NOTE — Telephone Encounter (Signed)
Spoke to patient to schedule her medicare wellness visit. Patient stated that she wanted to let Dr. Gilford Rile know that she is scheduled to have carpal tunnel surgery on 01/15/15. Patient stated that Dr. Christophe Louis will be performing the surgery on her Right hand. She will have her left hand done at a later date.

## 2015-01-07 ENCOUNTER — Encounter: Payer: Self-pay | Admitting: *Deleted

## 2015-01-07 ENCOUNTER — Telehealth: Payer: Self-pay | Admitting: Internal Medicine

## 2015-01-07 ENCOUNTER — Inpatient Hospital Stay: Admission: RE | Admit: 2015-01-07 | Payer: Self-pay | Source: Ambulatory Visit

## 2015-01-07 DIAGNOSIS — G5601 Carpal tunnel syndrome, right upper limb: Secondary | ICD-10-CM | POA: Diagnosis not present

## 2015-01-07 DIAGNOSIS — M069 Rheumatoid arthritis, unspecified: Secondary | ICD-10-CM | POA: Diagnosis not present

## 2015-01-07 DIAGNOSIS — Z79899 Other long term (current) drug therapy: Secondary | ICD-10-CM | POA: Diagnosis not present

## 2015-01-07 NOTE — Telephone Encounter (Signed)
The patient would like a call back from [place] regarding her appointment tomorrow that the patient canceled.

## 2015-01-07 NOTE — Patient Instructions (Addendum)
  Your procedure is scheduled on: 01-15-15 Report to [facility] 2nd Floor To find out your arrival time please call [phone] between 1PM - 3PM on 01-14-15  Remember: Instructions that are not followed completely may result in serious medical risk, up to and including death, or upon the discretion of your surgeon and anesthesiologist your surgery may need to be rescheduled.    __x__ 1. Do not eat food or drink liquids after midnight. No gum chewing or hard candies.     __x__ 2. No Alcohol for 24 hours before or after surgery.   ____ 3. Bring all medications with you on the day of surgery if instructed.    ____ 4. Notify your doctor if there is any change in your medical condition     (cold, fever, infections).     Do not wear jewelry, make-up, hairpins, clips or nail polish.  Do not wear lotions, powders, or perfumes. You may wear deodorant.  Do not shave 48 hours prior to surgery. Men may shave face and neck.  Do not bring valuables to the hospital.    [facility] is not responsible for any belongings or valuables.               Contacts, dentures or bridgework may not be worn into surgery.  Leave your suitcase in the car. After surgery it may be brought to your room.  For patients admitted to the hospital, discharge time is determined by your treatment team.   Patients discharged the day of surgery will not be allowed to drive home.   Please read over the following fact sheets that you were given:      ____ Take these medicines the morning of surgery with A SIP OF WATER:    1. May take Norco if needed  2.   3.   4.  5.  6.  ____ Fleet Enema (as directed)   ____ Use CHG Soap as directed  ____ Use inhalers on the day of surgery  ____ Stop metformin 2 days prior to surgery    ____ Take 1/2 of usual insulin dose the night before surgery and none on the morning of surgery.   ____ Stop Coumadin/Plavix/aspirin N/A  __X__ Stop Anti-inflammatories NOW  (NO NSAIDS OR ASA PRODUCTS-TYLENOL OK)   ____ Stop supplements until after surgery.    ____ Bring C-Pap to the hospital.

## 2015-01-08 ENCOUNTER — Ambulatory Visit: Payer: Commercial Managed Care - HMO

## 2015-01-14 ENCOUNTER — Encounter: Payer: Self-pay | Admitting: *Deleted

## 2015-01-15 ENCOUNTER — Ambulatory Visit: Payer: Medicare HMO | Admitting: Certified Registered"

## 2015-01-15 ENCOUNTER — Encounter
Admission: RE | Disposition: A | Discharge status: Home / Self Care | Payer: Self-pay | Source: Ambulatory Visit | Attending: Specialist

## 2015-01-15 ENCOUNTER — Encounter: Payer: Self-pay | Admitting: *Deleted

## 2015-01-15 ENCOUNTER — Ambulatory Visit
Admission: RE | Admit: 2015-01-15 | Discharge: 2015-01-15 | Disposition: A | Discharge status: Home / Self Care | Payer: Medicare HMO | Source: Ambulatory Visit | Attending: Specialist | Admitting: Specialist

## 2015-01-15 DIAGNOSIS — Z79899 Other long term (current) drug therapy: Secondary | ICD-10-CM | POA: Insufficient documentation

## 2015-01-15 DIAGNOSIS — M069 Rheumatoid arthritis, unspecified: Secondary | ICD-10-CM | POA: Insufficient documentation

## 2015-01-15 DIAGNOSIS — G5601 Carpal tunnel syndrome, right upper limb: Secondary | ICD-10-CM | POA: Diagnosis not present

## 2015-01-15 HISTORY — DX: Anxiety disorder, unspecified: F41.9

## 2015-01-15 HISTORY — DX: Anemia, unspecified: D64.9

## 2015-01-15 HISTORY — PX: CARPAL TUNNEL RELEASE: SHX101

## 2015-01-15 HISTORY — DX: Rheumatoid arthritis, unspecified: M06.9

## 2015-01-15 HISTORY — DX: Fibromyalgia: M79.7

## 2015-01-15 SURGERY — CARPAL TUNNEL RELEASE
Anesthesia: General | Laterality: Right | Wound class: Clean

## 2015-01-15 MED ORDER — ONDANSETRON HCL 4 MG/2ML IJ SOLN
INTRAMUSCULAR | Status: DC | PRN
  Administered 2015-01-15: 4 mg via INTRAVENOUS

## 2015-01-15 MED ORDER — BUPIVACAINE HCL 0.5 % IJ SOLN
INTRAMUSCULAR | Status: DC | PRN
  Administered 2015-01-15: 10 mL

## 2015-01-15 MED ORDER — OXYCODONE-ACETAMINOPHEN 10-325 MG PO TABS
1.0000 | ORAL_TABLET | ORAL | Status: DC | PRN
Start: 2015-01-15 — End: 2015-07-16

## 2015-01-15 MED ORDER — MIDAZOLAM HCL 2 MG/2ML IJ SOLN
INTRAMUSCULAR | Status: DC | PRN
  Administered 2015-01-15: 2 mg via INTRAVENOUS

## 2015-01-15 MED ORDER — FENTANYL CITRATE (PF) 100 MCG/2ML IJ SOLN: 25.0000 ug | INTRAMUSCULAR | Status: DC | PRN

## 2015-01-15 MED ORDER — KETAMINE HCL 50 MG/ML IJ SOLN
INTRAMUSCULAR | Status: DC | PRN
  Administered 2015-01-15: 25 mg via INTRAMUSCULAR

## 2015-01-15 MED ORDER — 0.9 % SODIUM CHLORIDE (POUR BTL) OPTIME
TOPICAL | Status: DC | PRN
  Administered 2015-01-15: 300 mL

## 2015-01-15 MED ORDER — BUPIVACAINE HCL (PF) 0.5 % IJ SOLN
INTRAMUSCULAR | Status: AC
  Filled 2015-01-15: qty 30

## 2015-01-15 MED ORDER — LACTATED RINGERS IV SOLN
INTRAVENOUS | Status: DC
  Administered 2015-01-15: 07:00:00 via INTRAVENOUS

## 2015-01-15 MED ORDER — PROPOFOL 10 MG/ML IV BOLUS
INTRAVENOUS | Status: DC | PRN
  Administered 2015-01-15: 200 mg via INTRAVENOUS

## 2015-01-15 MED ORDER — GLYCOPYRROLATE 0.2 MG/ML IJ SOLN
INTRAMUSCULAR | Status: DC | PRN
  Administered 2015-01-15: 0.2 mg via INTRAVENOUS

## 2015-01-15 MED ORDER — FAMOTIDINE 20 MG PO TABS
ORAL_TABLET | ORAL | Status: AC
  Filled 2015-01-15: qty 1

## 2015-01-15 MED ORDER — FENTANYL CITRATE (PF) 100 MCG/2ML IJ SOLN
INTRAMUSCULAR | Status: DC | PRN
  Administered 2015-01-15 (×4): 50 ug via INTRAVENOUS

## 2015-01-15 MED ORDER — LIDOCAINE HCL (CARDIAC) 20 MG/ML IV SOLN
INTRAVENOUS | Status: DC | PRN
  Administered 2015-01-15: 50 mg via INTRAVENOUS

## 2015-01-15 MED ORDER — LABETALOL HCL 5 MG/ML IV SOLN
INTRAVENOUS | Status: DC | PRN
  Administered 2015-01-15: 10 mg via INTRAVENOUS

## 2015-01-15 MED ORDER — FAMOTIDINE 20 MG PO TABS
20.0000 mg | ORAL_TABLET | Freq: Once | ORAL | Status: AC
  Administered 2015-01-15: 20 mg via ORAL

## 2015-01-15 MED ORDER — METHYLPREDNISOLONE ACETATE 40 MG/ML IJ SUSP
INTRAMUSCULAR | Status: DC | PRN
  Administered 2015-01-15: 40 mg

## 2015-01-15 MED ORDER — METHYLPREDNISOLONE ACETATE 40 MG/ML IJ SUSP
INTRAMUSCULAR | Status: AC
  Filled 2015-01-15: qty 1

## 2015-01-15 SURGICAL SUPPLY — 26 items
BANDAGE ELASTIC 3 CLIP NS LF (GAUZE/BANDAGES/DRESSINGS) ×2 IMPLANT
BNDG ESMARK 4X12 TAN STRL LF (GAUZE/BANDAGES/DRESSINGS) ×2 IMPLANT
CANISTER SUCT 1200ML W/VALVE (MISCELLANEOUS) ×2 IMPLANT
CAST PADDING 3X4FT ST 30246 (SOFTGOODS) ×1
CHLORAPREP W/TINT 26ML (MISCELLANEOUS) ×2 IMPLANT
GAUZE PETRO XEROFOAM 1X8 (MISCELLANEOUS) ×2 IMPLANT
GAUZE SPONGE 4X4 12PLY STRL (GAUZE/BANDAGES/DRESSINGS) ×2 IMPLANT
GLOVE BIO SURGEON STRL SZ7.5 (GLOVE) ×4 IMPLANT
GOWN STRL REUS W/ TWL LRG LVL3 (GOWN DISPOSABLE) ×2 IMPLANT
GOWN STRL REUS W/TWL LRG LVL3 (GOWN DISPOSABLE) ×4
KIT RM TURNOVER STRD PROC AR (KITS) ×2 IMPLANT
LOOP VESSEL SUPERMAXI WHITE (MISCELLANEOUS) ×1 IMPLANT
NS IRRIG 500ML POUR BTL (IV SOLUTION) ×2 IMPLANT
PACK EXTREMITY ARMC (MISCELLANEOUS) ×2 IMPLANT
PAD CAST CTTN 3X4 STRL (SOFTGOODS) ×2 IMPLANT
PAD GROUND ADULT SPLIT (MISCELLANEOUS) ×2 IMPLANT
PAD PREP 24X41 OB/GYN DISP (PERSONAL CARE ITEMS) ×2 IMPLANT
PADDING CAST 3IN STRL (MISCELLANEOUS)
PADDING CAST BLEND 3X4 STRL (MISCELLANEOUS) ×1 IMPLANT
PADDING CAST COTTON 3X4 STRL (SOFTGOODS) ×1
STOCKINETTE 48X4 2 PLY STRL (GAUZE/BANDAGES/DRESSINGS) ×1 IMPLANT
STOCKINETTE BIAS CUT 3 980034 (MISCELLANEOUS) ×1 IMPLANT
STOCKINETTE STRL 4IN 9604848 (GAUZE/BANDAGES/DRESSINGS) ×2 IMPLANT
SUT ETHILON 4-0 (SUTURE) ×2
SUT ETHILON 4-0 FS2 18XMFL BLK (SUTURE) ×1
SUTURE ETHLN 4-0 FS2 18XMF BLK (SUTURE) ×1 IMPLANT

## 2015-01-15 NOTE — Brief Op Note (Signed)
25/27/2016  8:57 AM  PATIENT:  Wanda Cervantes  64 y.o. female  PRE-OPERATIVE DIAGNOSIS:  CARPAL TUNNEL SYNDROME RIGHT  POST-OPERATIVE DIAGNOSIS:  CARPAL TUNNEL SYNDROME RIGHT  PROCEDURE:  Procedure(s): CARPAL TUNNEL RELEASE, SYNOVECTOMY (Right)  SURGEON:  Surgeon(s) and Role:    * Christophe Louis, MD - Primary  PHYSICIAN ASSISTANT:   ASSISTANTS: none   ANESTHESIA:   none  EBL:  Total I/O In: 700 [I.V.:700] Out: -   BLOOD ADMINISTERED:none  DRAINS: none   LOCAL MEDICATIONS USED:  MARCAINE     SPECIMEN:  No Specimen  DISPOSITION OF SPECIMEN:  N/A  COUNTS:  YES  TOURNIQUET:    DICTATION: .Other Dictation: Dictation Number [id number]  PLAN OF CARE: Discharge to home after PACU  PATIENT DISPOSITION:  PACU - hemodynamically stable.   Delay start of Pharmacological VTE agent (>24hrs) due to surgical blood loss or risk of bleeding: not applicable

## 2015-01-15 NOTE — Telephone Encounter (Signed)
Noted  

## 2015-01-15 NOTE — Discharge Instructions (Addendum)
Elevate hand at all times.  Keep dressing/splint clean and dry  Keep fingers moving at all times despite the pain     AMBULATORY SURGERY  DISCHARGE INSTRUCTIONS   1) The drugs that you were given will stay in your system until tomorrow so for the next 24 hours you should not:  A) Drive an automobile B) Make any legal decisions C) Drink any alcoholic beverage   2) You may resume regular meals tomorrow.  Today it is better to start with liquids and gradually work up to solid foods.  You may eat anything you prefer, but it is better to start with liquids, then soup and crackers, and gradually work up to solid foods.   3) Please notify your doctor immediately if you have any unusual bleeding, trouble breathing, redness and pain at the surgery site, drainage, fever, or pain not relieved by medication.  4) Additional Instructions:  5)

## 2015-01-15 NOTE — Anesthesia Preprocedure Evaluation (Addendum)
Anesthesia Evaluation  Patient identified by MRN, date of birth, ID band Patient awake    Reviewed: Allergy & Precautions, NPO status , Patient's Chart, lab work & pertinent test results  Airway Mallampati: I  TM Distance: >3 FB     Dental  (+) Teeth Intact   Pulmonary Current Smoker,    Pulmonary exam normal       Cardiovascular Normal cardiovascular exam    Neuro/Psych Anxiety    GI/Hepatic   Endo/Other    Renal/GU      Musculoskeletal  (+) Arthritis -, Rheumatoid disorders,  Fibromyalgia -  Abdominal Normal abdominal exam  (+)   Peds  Hematology   Anesthesia Other Findings   Reproductive/Obstetrics                            Anesthesia Physical Anesthesia Plan  ASA: III  Anesthesia Plan: General   Post-op Pain Management:    Induction: Intravenous  Airway Management Planned: LMA  Additional Equipment:   Intra-op Plan:   Post-operative Plan: Extubation in OR  Informed Consent: I have reviewed the patients History and Physical, chart, labs and discussed the procedure including the risks, benefits and alternatives for the proposed anesthesia with the patient or authorized representative who has indicated his/her understanding and acceptance.     Plan Discussed with: CRNA  Anesthesia Plan Comments:         Anesthesia Quick Evaluation

## 2015-01-15 NOTE — Brief Op Note (Signed)
01/15/2015  9:07 AM  PATIENT:  Wanda Cervantes  64 y.o. female  PRE-OPERATIVE DIAGNOSIS:  CARPAL TUNNEL SYNDROME RIGHT  POST-OPERATIVE DIAGNOSIS:  CARPAL TUNNEL SYNDROME RIGHT  PROCEDURE:  Procedure(s): CARPAL TUNNEL RELEASE, SYNOVECTOMY (Right)  SURGEON:  Surgeon(s) and Role:    * Christophe Louis, MD - Primary  PHYSICIAN ASSISTANT:   ASSISTANTS: none   ANESTHESIA:   general  EBL:  Total I/O In: 700 [I.V.:700] Out: -   BLOOD ADMINISTERED:none  DRAINS: none   LOCAL MEDICATIONS USED:  MARCAINE     SPECIMEN:  No Specimen  DISPOSITION OF SPECIMEN:  N/A  COUNTS:  YES  TOURNIQUET:    DICTATION: .Other Dictation: Dictation Number [id number]  PLAN OF CARE: Discharge to home after PACU  PATIENT DISPOSITION:  PACU - hemodynamically stable.   Delay start of Pharmacological VTE agent (>24hrs) due to surgical blood loss or risk of bleeding: not applicable

## 2015-01-15 NOTE — Op Note (Signed)
NAMEMARTA, Wanda Cervantes NO.:  [id number]  MEDICAL RECORD NO.:  [id number]  LOCATION:  [facility]                         FACILITY:  [facility]  PHYSICIAN:  Margaretmary Eddy, MD        DATE OF BIRTH:  [birth year]  DATE OF PROCEDURE:  01/15/2015 DATE OF DISCHARGE:  01/15/2015                              OPERATIVE REPORT   PREOPERATIVE DIAGNOSIS: 1. Right carpal tunnel syndrome. 2. 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  POSTOPERATIVE DIAGNOSIS: 1. Right carpal tunnel syndrome. 2. 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  PROCEDURE PERFORMED: 1. Right carpal tunnel release. 2. Complete flexor tenosynovectomy distal right forearm.  SURGEON:  Margaretmary Eddy, MD  ANESTHESIA:  General.  COMPLICATIONS:  None.  TOURNIQUET TIME:  40 minutes.  DESCRIPTION OF PROCEDURE:  After adequate induction of general anesthesia, the right upper extremity was thoroughly prepped with alcohol and ChloraPrep and draped in a standard sterile fashion.  The extremity was prepped out with the Esmarch bandage and the pneumatic tourniquet elevated to 250 mmHg.  Under lube magnification, standard volar carpal tunnel incision is made and then this is brought proximally and obliquely across the flexor wrist crease and down into the distal forearm ulnarly.  Dissection was carried down ulnarly and proximally and the median nerve identified.  The median nerve was then followed out and the transverse retinaculum was seen to be reformed and this was completely released out into the palm.  There was seen to be severe compression of the median nerve directly beneath the reformed retinaculum  Underneath the median nerve and in the distal forearm there was seen to be severe flexor tendon synovial hypertrophy involving every tendon.  This was associated with a moderate amount of fluids but no evidence of infection.  Carefully under loupe magnification,  complete tenosynovectomy of all the flexor tendons was performed. There was no evidence of tendon rupture although there were several tendons in which the synovium had begun to eat into the tendon.  The wound is thoroughly irrigated multiple times.  Median and ulnar nerve block are performed with 0.5% plain Marcaine.  Skin is closed with 4-0 nylon.  1 cc of Depo-Medrol is injected into the wound. Soft, bulky dressing is applied.  Volar splint is applied and the patient is returned to the recovery room in satisfactory condition having tolerated the procedure quite well.          ______________________________ Margaretmary Eddy, MD     CS/MEDQ  D:  01/15/2015  T:  01/15/2015  Job:  [id number]

## 2015-01-15 NOTE — Anesthesia Procedure Notes (Signed)
Procedure Name: LMA Insertion Date/Time: 01/15/2015 7:37 AM Performed by: Rolla Plate Pre-anesthesia Checklist: Patient identified, Emergency Drugs available, Suction available, Patient being monitored and Timeout performed Patient Re-evaluated:Patient Re-evaluated prior to inductionOxygen Delivery Method: Circle system utilized Preoxygenation: Pre-oxygenation with 100% oxygen Intubation Type: IV induction LMA: LMA inserted LMA Size: 4.0 Number of attempts: 1 Placement Confirmation: positive ETCO2 and breath sounds checked- equal and bilateral Tube secured with: Tape Dental Injury: Teeth and Oropharynx as per pre-operative assessment

## 2015-01-15 NOTE — Anesthesia Postprocedure Evaluation (Signed)
  Anesthesia Post-op Note  Patient: Wanda Cervantes  Procedure(s) Performed: Procedure(s): CARPAL TUNNEL RELEASE, SYNOVECTOMY (Right)  Anesthesia type:General  Patient location: PACU  Post pain: Pain level controlled  Post assessment: Post-op Vital signs reviewed, Patient's Cardiovascular Status Stable, Respiratory Function Stable, Patent Airway and No signs of Nausea or vomiting  Post vital signs: Reviewed and stable  Last Vitals:  Filed Vitals:   01/15/15 0841  BP: 131/97  Pulse: 102  Temp: 36.2 C  Resp: 14    Level of consciousness: awake, alert  and patient cooperative  Complications: No apparent anesthesia complications

## 2015-01-15 NOTE — H&P (Signed)
  64 year old female with recurrent right carpal tunnel syndrome.  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  VS stable  Hear and lungs clear.Marland Kitchen  ENT exam normal.  Plan: carpal tunnel release with probable flexor tendon tenosynovectomy.

## 2015-01-15 NOTE — Transfer of Care (Signed)
Immediate Anesthesia Transfer of Care Note  Patient: Wanda Cervantes  Procedure(s) Performed: Procedure(s): CARPAL TUNNEL RELEASE, SYNOVECTOMY (Right)  Patient Location: PACU  Anesthesia Type:General  Level of Consciousness: awake and alert   Airway & Oxygen Therapy: Patient Spontanous Breathing and Patient connected to face mask oxygen  Post-op Assessment: Report given to RN  Post vital signs: Reviewed  Last Vitals:  Filed Vitals:   01/15/15 0841  BP: 131/97  Pulse: 102  Temp: 36.2 C  Resp: 14    Complications: No apparent anesthesia complications

## 2015-03-11 ENCOUNTER — Inpatient Hospital Stay: Admission: RE | Admit: 2015-03-11 | Payer: Commercial Managed Care - HMO | Source: Ambulatory Visit

## 2015-03-11 ENCOUNTER — Encounter: Payer: Self-pay | Admitting: *Deleted

## 2015-03-11 NOTE — Patient Instructions (Signed)
  Your procedure is scheduled on: 03-19-15 Report to [facility]. To find out your arrival time please call [phone] between 1PM - 3PM on 03-18-15  Remember: Instructions that are not followed completely may result in serious medical risk, up to and including death, or upon the discretion of your surgeon and anesthesiologist your surgery may need to be rescheduled.    __X__ 1. Do not eat food or drink liquids after midnight. No gum chewing or hard candies.     __X__ 2. No Alcohol for 24 hours before or after surgery.   ____ 3. Bring all medications with you on the day of surgery if instructed.    ____ 4. Notify your doctor if there is any change in your medical condition     (cold, fever, infections).     Do not wear jewelry, make-up, hairpins, clips or nail polish.  Do not wear lotions, powders, or perfumes. You may wear deodorant.  Do not shave 48 hours prior to surgery. Men may shave face and neck.  Do not bring valuables to the hospital.    [facility] is not responsible for any belongings or valuables.               Contacts, dentures or bridgework may not be worn into surgery.  Leave your suitcase in the car. After surgery it may be brought to your room.  For patients admitted to the hospital, discharge time is determined by your  treatment team.   Patients discharged the day of surgery will not be allowed to drive home.   Please read over the following fact sheets that you were given:     ____ Take these medicines the morning of surgery with A SIP OF WATER:    1. NONE  2.   3.   4.  5.  6.  ____ Fleet Enema (as directed)   ____ Use CHG Soap as directed  ____ Use inhalers on the day of surgery  ____ Stop metformin 2 days prior to surgery    ____ Take 1/2 of usual insulin dose the night before surgery and none on the morning of surgery.   ____ Stop Coumadin/Plavix/aspirin-N/A  ____ Stop Anti-inflammatories-STOP ALEVE-NO NSAIDS OR  ASA PRODUCTS-TYLENOL OK   __X__ Stop supplements until after surgery- STOP TURMERIC NOW   ____ Bring C-Pap to the hospital.

## 2015-03-15 ENCOUNTER — Encounter: Payer: Self-pay | Admitting: Internal Medicine

## 2015-03-15 ENCOUNTER — Ambulatory Visit (INDEPENDENT_AMBULATORY_CARE_PROVIDER_SITE_OTHER): Payer: Medicare HMO | Admitting: Internal Medicine

## 2015-03-15 VITALS — BP 126/75 | HR 86 | Temp 98.0°F | Ht 60.75 in | Wt 160.4 lb

## 2015-03-15 DIAGNOSIS — E538 Deficiency of other specified B group vitamins: Secondary | ICD-10-CM | POA: Diagnosis not present

## 2015-03-15 DIAGNOSIS — Z0000 Encounter for general adult medical examination without abnormal findings: Secondary | ICD-10-CM

## 2015-03-15 DIAGNOSIS — Z1211 Encounter for screening for malignant neoplasm of colon: Secondary | ICD-10-CM

## 2015-03-15 LAB — MICROALBUMIN / CREATININE URINE RATIO
CREATININE, U: 86.8 mg/dL
MICROALB UR: 1.1 mg/dL (ref 0.0–1.9)
MICROALB/CREAT RATIO: 1.3 mg/g (ref 0.0–30.0)

## 2015-03-15 LAB — LIPID PANEL
Cholesterol: 229 mg/dL — ABNORMAL HIGH (ref 0–200)
HDL: 66.8 mg/dL (ref >39.00)
LDL Cholesterol: 126 mg/dL — ABNORMAL HIGH (ref 0–99)
NonHDL: 162.2
TRIGLYCERIDES: 180 mg/dL — AB (ref 0.0–149.0)
Total CHOL/HDL Ratio: 3
VLDL: 36 mg/dL (ref 0.0–40.0)

## 2015-03-15 LAB — COMPREHENSIVE METABOLIC PANEL
ALT: 15 U/L (ref 0–35)
AST: 13 U/L (ref 0–37)
Albumin: 4.2 g/dL (ref 3.5–5.2)
Alkaline Phosphatase: 106 U/L (ref 39–117)
BUN: 14 mg/dL (ref 6–23)
CALCIUM: 9.5 mg/dL (ref 8.4–10.5)
CO2: 27 meq/L (ref 19–32)
Chloride: 102 mEq/L (ref 96–112)
Creatinine, Ser: 0.55 mg/dL (ref 0.40–1.20)
GFR: 119.2 mL/min (ref >60.00)
Glucose, Bld: 89 mg/dL (ref 70–99)
Potassium: 4.2 mEq/L (ref 3.5–5.1)
SODIUM: 140 meq/L (ref 135–145)
Total Bilirubin: 0.5 mg/dL (ref 0.2–1.2)
Total Protein: 6.4 g/dL (ref 6.0–8.3)

## 2015-03-15 LAB — CBC WITH DIFFERENTIAL/PLATELET
Basophils Absolute: 0 10*3/uL (ref 0.0–0.1)
Basophils Relative: 0.4 % (ref 0.0–3.0)
EOS PCT: 2.5 % (ref 0.0–5.0)
Eosinophils Absolute: 0.2 10*3/uL (ref 0.0–0.7)
HCT: 42.5 % (ref 36.0–46.0)
Hemoglobin: 14.2 g/dL (ref 12.0–15.0)
Lymphocytes Relative: 25.2 % (ref 12.0–46.0)
Lymphs Abs: 2.5 10*3/uL (ref 0.7–4.0)
MCHC: 33.5 g/dL (ref 30.0–36.0)
MCV: 91.8 fl (ref 78.0–100.0)
MONO ABS: 0.8 10*3/uL (ref 0.1–1.0)
Monocytes Relative: 8.5 % (ref 3.0–12.0)
NEUTROS PCT: 63.4 % (ref 43.0–77.0)
Neutro Abs: 6.3 10*3/uL (ref 1.4–7.7)
PLATELETS: 303 10*3/uL (ref 150.0–400.0)
RBC: 4.63 Mil/uL (ref 3.87–5.11)
RDW: 14.6 % (ref 11.5–15.5)
WBC: 9.9 10*3/uL (ref 4.0–10.5)

## 2015-03-15 LAB — HM COLONOSCOPY

## 2015-03-15 LAB — VITAMIN D 25 HYDROXY (VIT D DEFICIENCY, FRACTURES): VITD: 25.49 ng/mL — ABNORMAL LOW (ref 30.00–100.00)

## 2015-03-15 MED ORDER — CYANOCOBALAMIN 1000 MCG/ML IJ SOLN
1000.0000 ug | Freq: Once | INTRAMUSCULAR | Status: AC
  Administered 2015-03-15: 1000 ug via INTRAMUSCULAR

## 2015-03-15 NOTE — Assessment & Plan Note (Signed)
General medical exam normal today including breast exam. PAP and pelvic exam deferred as last PAP 06/2012, normal, HPV neg. Plan repeat in 2017. Mammogram UTD and reviewed. Colonoscopy ordered. Discussed lung cancer screening with CT chest. She prefers to hold off for now but will contact us if interested. Encouraged smoking cessation. Labs as ordered.

## 2015-03-15 NOTE — Progress Notes (Signed)
The patient is here for annual Medicare Wellness Examination and management of other chronic and acute problems.   The risk factors are reflected in the history.  The roster of all physicians providing medical care to patient - is listed in the Snapshot section of the chart.  Activities of daily living:   The patient is 100% independent in all ADLs: dressing, toileting, feeding as well as independent mobility. Patient lives with husband. Has 2 poodles in home. Lives in one story home with hard floors and carpeted floors.  Home safety :  The patient has smoke detectors in the home and alarm system. They wear seatbelts in their car. There are no firearms at home.  There is no violence in the home. They feel safe where they live.  Infectious Risks: There is no risks for hepatitis, STDs or HIV.  There is no  history of blood transfusion.  They have no travel history to infectious disease endemic areas of the world.  Additional Health Care Providers: The patient has seen their dentist in the last six months. Dentist - Bing Neighbors, however currently looking. They have seen their eye doctor in the last year. Opthalmologist - [facility] They deny hearing issues. They have deferred audiologic testing in the last year.   They do not  have excessive sun exposure. Discussed the need for sun protection: hats,long sleeves and use of sunscreen if there is significant sun exposure.  Dermatologist - Dr. Nehemiah Massed Rheumatologist - Dr. Dossie Der  Diet: the importance of a healthy diet is discussed. They do have a healthy diet. Increased veggies, less meat  Wt Readings from Last 3 Encounters:  03/15/15 160 lb 6 oz (72.746 kg)  03/11/15 158 lb (71.668 kg)  07/06/14 158 lb 8 oz (71.895 kg)   The benefits of regular aerobic exercise were discussed. Exercise limited by RA pain.  Depression screen: there are no signs or vegative symptoms of depression- irritability, change in appetite, anhedonia,  sadness/tearfullness.  Cognitive assessment: the patient manages all their financial and personal affairs and is actively engaged.   HCPOA - husband Living Will - yes  The following portions of the patient's history were reviewed and updated as appropriate: allergies, current medications, past family history, past medical history,  past surgical history, past social history and problem list.  Visual acuity was not assessed per patient preference as they have regular follow up with their ophthalmologist. Hearing and body mass index were assessed and reviewed.   During the course of the visit the patient was educated and counseled about appropriate screening and preventive services including : fall prevention , diabetes screening, nutrition counseling, colorectal cancer screening, and recommended immunizations.    Review of Systems  Constitutional: Negative for fever, chills, appetite change, fatigue and unexpected weight change.  Eyes: Negative for visual disturbance.  Respiratory: Negative for shortness of breath.   Cardiovascular: Negative for chest pain and leg swelling.  Gastrointestinal: Negative for nausea, vomiting, abdominal pain, diarrhea and constipation.  Musculoskeletal: Positive for myalgias, joint swelling and arthralgias.  Skin: Negative for color change and rash.  Neurological: Negative for weakness.  Hematological: Negative for adenopathy. Does not bruise/bleed easily.  Psychiatric/Behavioral: Negative for suicidal ideas, sleep disturbance and dysphoric mood. The patient is not nervous/anxious.        Objective:    BP 126/75 mmHg  Pulse 86  Temp(Src) 98 F (36.7 C) (Oral)  Ht 5' 0.75" (1.543 m)  Wt 160 lb 6 oz (72.746 kg)  BMI 30.55 kg/m2  SpO2 97% Physical Exam  Constitutional: She is oriented to person, place, and time. She appears well-developed and well-nourished. No distress.  HENT:  Head: Normocephalic and atraumatic.  Right Ear: External ear normal.   Left Ear: External ear normal.  Nose: Nose normal.  Mouth/Throat: Oropharynx is clear and moist. No oropharyngeal exudate.  Eyes: Conjunctivae are normal. Pupils are equal, round, and reactive to light. Right eye exhibits no discharge. Left eye exhibits no discharge. No scleral icterus.  Neck: Normal range of motion. Neck supple. No tracheal deviation present. No thyromegaly present.  Cardiovascular: Normal rate, regular rhythm, normal heart sounds and intact distal pulses.  Exam reveals no gallop and no friction rub.   No murmur heard. Pulmonary/Chest: Effort normal and breath sounds normal. No accessory muscle usage. No tachypnea. No respiratory distress. She has no decreased breath sounds. She has no wheezes. She has no rales. She exhibits no tenderness. Right breast exhibits no inverted nipple, no mass, no nipple discharge, no skin change and no tenderness. Left breast exhibits no inverted nipple, no mass, no nipple discharge, no skin change and no tenderness. Breasts are symmetrical.  Abdominal: Soft. Bowel sounds are normal. She exhibits no distension and no mass. There is no tenderness. There is no rebound and no guarding.  Musculoskeletal: Normal range of motion. She exhibits no edema or tenderness.  Lymphadenopathy:    She has no cervical adenopathy.  Neurological: She is alert and oriented to person, place, and time. No cranial nerve deficit. She exhibits normal muscle tone. Coordination normal.  Skin: Skin is warm and dry. No rash noted. She is not diaphoretic. No erythema. No pallor.  Psychiatric: She has a normal mood and affect. Her behavior is normal. Judgment and thought content normal.          Assessment & Plan:  Patient was given a handout regarding current recommendations for health maintenance and preventative care on the AVS.  Problem List Items Addressed This Visit      Unprioritized   Medicare annual wellness visit, subsequent - Primary    General medical exam  normal today including breast exam. PAP and pelvic exam deferred as last PAP 06/2012, normal, HPV neg. Plan repeat in 2017. Mammogram UTD and reviewed. Colonoscopy ordered. Discussed lung cancer screening with CT chest. She prefers to hold off for now but will contact us if interested. Encouraged smoking cessation. Labs as ordered.      Relevant Orders   CBC with Differential/Platelet   Comprehensive metabolic panel   Lipid panel   Microalbumin / creatinine urine ratio   Vit D  25 hydroxy (rtn osteoporosis monitoring)    Other Visit Diagnoses    Special screening for malignant neoplasms, colon        Relevant Orders    Ambulatory referral to Colorectal Surgery    B12 deficiency        Relevant Medications    cyanocobalamin ((VITAMIN B-12)) injection 1,000 mcg (Completed) (Start on 03/15/2015 10:45 AM)        Return in about 6 months (around 09/15/2015) for Recheck.

## 2015-03-15 NOTE — Patient Instructions (Signed)

## 2015-03-15 NOTE — Progress Notes (Signed)
Pre visit review using our clinic review tool, if applicable. No additional management support is needed unless otherwise documented below in the visit note. 

## 2015-03-17 ENCOUNTER — Encounter: Payer: Self-pay | Admitting: Internal Medicine

## 2015-03-19 ENCOUNTER — Encounter: Payer: Self-pay | Admitting: *Deleted

## 2015-03-19 ENCOUNTER — Ambulatory Visit: Payer: Medicare HMO | Admitting: Anesthesiology

## 2015-03-19 ENCOUNTER — Ambulatory Visit
Admission: RE | Admit: 2015-03-19 | Discharge: 2015-03-19 | Disposition: A | Discharge status: Home / Self Care | Payer: Medicare HMO | Source: Ambulatory Visit | Attending: Specialist | Admitting: Specialist

## 2015-03-19 ENCOUNTER — Encounter
Admission: RE | Disposition: A | Discharge status: Home / Self Care | Payer: Self-pay | Source: Ambulatory Visit | Attending: Specialist

## 2015-03-19 DIAGNOSIS — K219 Gastro-esophageal reflux disease without esophagitis: Secondary | ICD-10-CM | POA: Insufficient documentation

## 2015-03-19 DIAGNOSIS — Z79899 Other long term (current) drug therapy: Secondary | ICD-10-CM | POA: Insufficient documentation

## 2015-03-19 DIAGNOSIS — G5602 Carpal tunnel syndrome, left upper limb: Secondary | ICD-10-CM | POA: Diagnosis present

## 2015-03-19 DIAGNOSIS — Z833 Family history of diabetes mellitus: Secondary | ICD-10-CM | POA: Insufficient documentation

## 2015-03-19 DIAGNOSIS — F17200 Nicotine dependence, unspecified, uncomplicated: Secondary | ICD-10-CM | POA: Diagnosis not present

## 2015-03-19 DIAGNOSIS — M65832 Other synovitis and tenosynovitis, left forearm: Secondary | ICD-10-CM | POA: Insufficient documentation

## 2015-03-19 DIAGNOSIS — Z8249 Family history of ischemic heart disease and other diseases of the circulatory system: Secondary | ICD-10-CM | POA: Diagnosis not present

## 2015-03-19 DIAGNOSIS — Z8261 Family history of arthritis: Secondary | ICD-10-CM | POA: Insufficient documentation

## 2015-03-19 DIAGNOSIS — M069 Rheumatoid arthritis, unspecified: Secondary | ICD-10-CM | POA: Diagnosis not present

## 2015-03-19 DIAGNOSIS — Z809 Family history of malignant neoplasm, unspecified: Secondary | ICD-10-CM | POA: Insufficient documentation

## 2015-03-19 DIAGNOSIS — D649 Anemia, unspecified: Secondary | ICD-10-CM | POA: Diagnosis not present

## 2015-03-19 DIAGNOSIS — M797 Fibromyalgia: Secondary | ICD-10-CM | POA: Insufficient documentation

## 2015-03-19 HISTORY — PX: ULNAR NERVE TRANSPOSITION: SHX2595

## 2015-03-19 HISTORY — PX: CARPAL TUNNEL RELEASE: SHX101

## 2015-03-19 HISTORY — DX: Cardiac murmur, unspecified: R01.1

## 2015-03-19 SURGERY — CARPAL TUNNEL RELEASE
Anesthesia: General | Site: Arm Lower | Laterality: Left | Wound class: Clean

## 2015-03-19 MED ORDER — PROPOFOL 10 MG/ML IV BOLUS
INTRAVENOUS | Status: DC | PRN
  Administered 2015-03-19: 40 mg via INTRAVENOUS
  Administered 2015-03-19: 200 mg via INTRAVENOUS

## 2015-03-19 MED ORDER — DEXAMETHASONE SODIUM PHOSPHATE 10 MG/ML IJ SOLN
INTRAMUSCULAR | Status: AC
  Filled 2015-03-19: qty 1

## 2015-03-19 MED ORDER — LACTATED RINGERS IV SOLN
INTRAVENOUS | Status: DC
  Administered 2015-03-19 (×2): via INTRAVENOUS

## 2015-03-19 MED ORDER — FENTANYL CITRATE (PF) 100 MCG/2ML IJ SOLN
INTRAMUSCULAR | Status: AC
  Administered 2015-03-19: 25 ug via INTRAVENOUS
  Filled 2015-03-19: qty 2

## 2015-03-19 MED ORDER — BUPIVACAINE HCL (PF) 0.5 % IJ SOLN
INTRAMUSCULAR | Status: AC
  Filled 2015-03-19: qty 30

## 2015-03-19 MED ORDER — FAMOTIDINE 20 MG PO TABS
20.0000 mg | ORAL_TABLET | Freq: Once | ORAL | Status: AC
  Administered 2015-03-19: 20 mg via ORAL

## 2015-03-19 MED ORDER — LIDOCAINE HCL (CARDIAC) 20 MG/ML IV SOLN
INTRAVENOUS | Status: DC | PRN
  Administered 2015-03-19: 30 mg via INTRAVENOUS

## 2015-03-19 MED ORDER — CEFAZOLIN SODIUM-DEXTROSE 2-3 GM-% IV SOLR
INTRAVENOUS | Status: AC
Start: 2015-03-19 — End: 2015-03-19
  Administered 2015-03-19: 2 g via INTRAVENOUS
  Filled 2015-03-19: qty 50

## 2015-03-19 MED ORDER — FENTANYL CITRATE (PF) 100 MCG/2ML IJ SOLN: INTRAMUSCULAR | Status: DC | PRN

## 2015-03-19 MED ORDER — ACETAMINOPHEN 10 MG/ML IV SOLN
INTRAVENOUS | Status: DC | PRN
  Administered 2015-03-19: 1000 mg via INTRAVENOUS

## 2015-03-19 MED ORDER — ONDANSETRON HCL 4 MG/2ML IJ SOLN: 4.0000 mg | Freq: Once | INTRAMUSCULAR | Status: DC | PRN

## 2015-03-19 MED ORDER — FENTANYL CITRATE (PF) 100 MCG/2ML IJ SOLN
25.0000 ug | INTRAMUSCULAR | Status: DC | PRN
  Administered 2015-03-19 (×4): 25 ug via INTRAVENOUS

## 2015-03-19 MED ORDER — ONDANSETRON HCL 4 MG/2ML IJ SOLN: INTRAMUSCULAR | Status: DC | PRN

## 2015-03-19 MED ORDER — CEFAZOLIN SODIUM-DEXTROSE 2-3 GM-% IV SOLR: 2.0000 g | Freq: Once | INTRAVENOUS | Status: DC

## 2015-03-19 MED ORDER — ESMOLOL HCL 10 MG/ML IV SOLN
INTRAVENOUS | Status: DC | PRN
  Administered 2015-03-19: 10 mg via INTRAVENOUS
  Administered 2015-03-19: 20 mg via INTRAVENOUS

## 2015-03-19 MED ORDER — ONDANSETRON HCL 4 MG/2ML IJ SOLN
INTRAMUSCULAR | Status: DC | PRN
  Administered 2015-03-19: 4 mg via INTRAVENOUS

## 2015-03-19 MED ORDER — DEXAMETHASONE SODIUM PHOSPHATE 10 MG/ML IJ SOLN
INTRAMUSCULAR | Status: DC | PRN
  Administered 2015-03-19: 10 mg via INTRAMUSCULAR

## 2015-03-19 MED ORDER — MIDAZOLAM HCL 2 MG/2ML IJ SOLN
INTRAMUSCULAR | Status: DC | PRN
  Administered 2015-03-19: 2 mg via INTRAVENOUS

## 2015-03-19 MED ORDER — DEXAMETHASONE SODIUM PHOSPHATE 4 MG/ML IJ SOLN
INTRAMUSCULAR | Status: DC | PRN
  Administered 2015-03-19: 8 mg via INTRAVENOUS

## 2015-03-19 MED ORDER — FENTANYL CITRATE (PF) 100 MCG/2ML IJ SOLN
INTRAMUSCULAR | Status: DC | PRN
  Administered 2015-03-19 (×3): 50 ug via INTRAVENOUS
  Administered 2015-03-19 (×2): 25 ug via INTRAVENOUS

## 2015-03-19 MED ORDER — BUPIVACAINE HCL 0.5 % IJ SOLN
INTRAMUSCULAR | Status: DC | PRN
  Administered 2015-03-19: 6 mL

## 2015-03-19 MED ORDER — ACETAMINOPHEN 10 MG/ML IV SOLN
INTRAVENOUS | Status: AC
  Filled 2015-03-19: qty 100

## 2015-03-19 MED ORDER — CHLORHEXIDINE GLUCONATE 4 % EX LIQD: Freq: Once | CUTANEOUS | Status: DC

## 2015-03-19 MED ORDER — FAMOTIDINE 20 MG PO TABS
ORAL_TABLET | ORAL | Status: AC
  Filled 2015-03-19: qty 1

## 2015-03-19 SURGICAL SUPPLY — 46 items
BANDAGE ELASTIC 3 CLIP NS LF (GAUZE/BANDAGES/DRESSINGS) ×2 IMPLANT
BANDAGE ELASTIC 4 CLIP NS LF (GAUZE/BANDAGES/DRESSINGS) ×4 IMPLANT
BNDG COHESIVE 4X5 TAN STRL (GAUZE/BANDAGES/DRESSINGS) ×2 IMPLANT
BNDG ESMARK 4X12 TAN STRL LF (GAUZE/BANDAGES/DRESSINGS) ×2 IMPLANT
CANISTER SUCT 1200ML W/VALVE (MISCELLANEOUS) ×2 IMPLANT
CAST PADDING 3X4FT ST 30246 (SOFTGOODS) ×2
CHLORAPREP W/TINT 26ML (MISCELLANEOUS) ×2 IMPLANT
DRAIN PENROSE 1/4X12 LTX (DRAIN) ×1 IMPLANT
GAUZE PETRO XEROFOAM 1X8 (MISCELLANEOUS) ×2 IMPLANT
GAUZE SPONGE 4X4 12PLY STRL (GAUZE/BANDAGES/DRESSINGS) ×2 IMPLANT
GLOVE BIO SURGEON STRL SZ7.5 (GLOVE) ×2 IMPLANT
GLOVE BIO SURGEON STRL SZ8 (GLOVE) ×2 IMPLANT
GOWN STRL REUS W/ TWL LRG LVL3 (GOWN DISPOSABLE) ×2 IMPLANT
GOWN STRL REUS W/TWL LRG LVL3 (GOWN DISPOSABLE) ×4
KIT RM TURNOVER STRD PROC AR (KITS) ×2 IMPLANT
NDL MAYO CATGUT SZ5 (NEEDLE)
NDL SUT 5 .5 CRC TROC PNT MAYO (NEEDLE) ×1 IMPLANT
NS IRRIG 1000ML POUR BTL (IV SOLUTION) ×2 IMPLANT
NS IRRIG 500ML POUR BTL (IV SOLUTION) ×2 IMPLANT
PACK EXTREMITY ARMC (MISCELLANEOUS) ×2 IMPLANT
PAD CAST CTTN 3X4 STRL (SOFTGOODS) ×2 IMPLANT
PAD CAST CTTN 4X4 STRL (SOFTGOODS) ×2 IMPLANT
PAD GROUND ADULT SPLIT (MISCELLANEOUS) ×2 IMPLANT
PAD PREP 24X41 OB/GYN DISP (PERSONAL CARE ITEMS) ×2 IMPLANT
PADDING CAST 3IN STRL (MISCELLANEOUS) ×1
PADDING CAST BLEND 3X4 STRL (MISCELLANEOUS) ×1 IMPLANT
PADDING CAST COTTON 3X4 STRL (SOFTGOODS) ×2
PADDING CAST COTTON 4X4 STRL (SOFTGOODS) ×4
SLING ARM LRG DEEP (SOFTGOODS) ×2 IMPLANT
SPLINT CAST 1 STEP 4X30 (MISCELLANEOUS) ×2 IMPLANT
SPONGE LAP 18X18 5 PK (GAUZE/BANDAGES/DRESSINGS) ×2 IMPLANT
STAPLER SKIN PROX 35W (STAPLE) ×2 IMPLANT
STOCKINETTE 48X4 2 PLY STRL (GAUZE/BANDAGES/DRESSINGS) ×1 IMPLANT
STOCKINETTE IMPERVIOUS 9X36 MD (GAUZE/BANDAGES/DRESSINGS) ×2 IMPLANT
STOCKINETTE STRL 4IN 9604848 (GAUZE/BANDAGES/DRESSINGS) ×2 IMPLANT
STRIP CLOSURE SKIN 1/2X4 (GAUZE/BANDAGES/DRESSINGS) ×2 IMPLANT
SUT ETHILON 4-0 (SUTURE) ×4
SUT ETHILON 4-0 FS2 18XMFL BLK (SUTURE) ×2
SUT MERSILENE 4-0 WHT RB-1 (SUTURE) ×1 IMPLANT
SUT PROLENE 3 0 PS 2 (SUTURE) ×1 IMPLANT
SUT VIC AB 3-0 SH 27 (SUTURE)
SUT VIC AB 3-0 SH 27X BRD (SUTURE) ×1 IMPLANT
SUTURE ETHLN 4-0 FS2 18XMF BLK (SUTURE) ×1 IMPLANT
WIRE Z .035 C-WIRE SPADE TIP (WIRE) ×2 IMPLANT
WIRE Z .045 C-WIRE SPADE TIP (WIRE) ×2 IMPLANT
WIRE Z .062 C-WIRE SPADE TIP (WIRE) ×2 IMPLANT

## 2015-03-19 NOTE — H&P (Signed)
  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  History and physical inserted into the chart as a paper document from most recent office visit.  Heart and lungs clear.  ENT normal  Plan: left carpal tunnel release and flexor tenosynovectomy.

## 2015-03-19 NOTE — Brief Op Note (Signed)
03/19/2015  8:58 AM  PATIENT:  Wanda Cervantes  64 y.o. female  PRE-OPERATIVE DIAGNOSIS:  carpal tunnel syndrome  POST-OPERATIVE DIAGNOSIS:  carpal tunnel syndrome  PROCEDURE:  Procedure(s): CARPAL TUNNEL RELEASE (Left) ULNAR NERVE DECOMPRESSION/TRANSPOSITION (Left)  SURGEON:  Surgeon(s) and Role:    * Christophe Louis, MD - Primary  PHYSICIAN ASSISTANT:   ASSISTANTS: none   ANESTHESIA:   general  EBL:  Total I/O In: 750 [I.V.:750] Out: -   BLOOD ADMINISTERED:none  DRAINS: none   LOCAL MEDICATIONS USED:  MARCAINE     SPECIMEN:  No Specimen  DISPOSITION OF SPECIMEN:  N/A  COUNTS:  YES  TOURNIQUET:    DICTATION: .Other Dictation: Dictation Number [id number]  PLAN OF CARE: Discharge to home after PACU  PATIENT DISPOSITION:  PACU - hemodynamically stable.   Delay start of Pharmacological VTE agent (>24hrs) due to surgical blood loss or risk of bleeding: not applicable

## 2015-03-19 NOTE — Transfer of Care (Incomplete)
Immediate Anesthesia Transfer of Care Note  Patient: Wanda Cervantes  Procedure(s) Performed: Procedure(s): CARPAL TUNNEL RELEASE (Left) ULNAR NERVE DECOMPRESSION/TRANSPOSITION (Left)  Patient Location: {PLACES; ANE POST:19477::"PACU"}  Anesthesia Type:{PROCEDURES; ANE POST ANESTHESIA TYPE:19480}  Level of Consciousness: {FINDINGS; ANE POST LEVEL OF CONSCIOUSNESS:19484}  Airway & Oxygen Therapy: {Exam; oxygen device:30095}  Post-op Assessment: {ASSESSMENT;POST-OP HWKGSU:11031}  Post vital signs: {DESC; ANE POST RXYVOP:92924}  Last Vitals:  Filed Vitals:   03/19/15 0904  BP: 149/89  Pulse: 100  Temp:   Resp: 12    Complications: {FINDINGS; ANE POST COMPLICATIONS:19485}

## 2015-03-19 NOTE — Transfer of Care (Signed)
Immediate Anesthesia Transfer of Care Note  Patient: Wanda Cervantes  Procedure(s) Performed: Procedure(s): CARPAL TUNNEL RELEASE (Left) ULNAR NERVE DECOMPRESSION/TRANSPOSITION (Left)  Patient Location: PACU  Anesthesia Type:General  Level of Consciousness: awake  Airway & Oxygen Therapy: Patient Spontanous Breathing and Patient connected to face mask oxygen  Post-op Assessment: Report given to RN and Post -op Vital signs reviewed and stable  Post vital signs: Reviewed and stable  Last Vitals:  Filed Vitals:   03/19/15 0849  BP: 166/95  Pulse: 105  Temp: 36.7 C  Resp: 21    Complications: No apparent anesthesia complications

## 2015-03-19 NOTE — Anesthesia Preprocedure Evaluation (Addendum)
Anesthesia Evaluation  Patient identified by MRN, date of birth, ID band Patient awake    Reviewed: Allergy & Precautions, NPO status , Patient's Chart, lab work & pertinent test results  History of Anesthesia Complications Negative for: history of anesthetic complications  Airway Mallampati: II  TM Distance: >3 FB Neck ROM: Full    Dental  (+) Teeth Intact   Pulmonary Current Smoker (1/4 ppd),          Cardiovascular     Neuro/Psych  Neuromuscular disease    GI/Hepatic GERD-  Medicated,  Endo/Other    Renal/GU      Musculoskeletal  (+) Arthritis -, Rheumatoid disorders,  Fibromyalgia -  Abdominal   Peds  Hematology  (+) anemia ,   Anesthesia Other Findings   Reproductive/Obstetrics                            Anesthesia Physical Anesthesia Plan  ASA: III  Anesthesia Plan: General   Post-op Pain Management:    Induction: Intravenous  Airway Management Planned: LMA  Additional Equipment:   Intra-op Plan:   Post-operative Plan:   Informed Consent: I have reviewed the patients History and Physical, chart, labs and discussed the procedure including the risks, benefits and alternatives for the proposed anesthesia with the patient or authorized representative who has indicated his/her understanding and acceptance.     Plan Discussed with:   Anesthesia Plan Comments:         Anesthesia Quick Evaluation

## 2015-03-19 NOTE — Anesthesia Postprocedure Evaluation (Signed)
  Anesthesia Post-op Note  Patient: Wanda Cervantes  Procedure(s) Performed: Procedure(s): CARPAL TUNNEL RELEASE (Left) ULNAR NERVE DECOMPRESSION/TRANSPOSITION (Left)  Anesthesia type:General  Patient location: PACU  Post pain: Pain level controlled  Post assessment: Post-op Vital signs reviewed, Patient's Cardiovascular Status Stable, Respiratory Function Stable, Patent Airway and No signs of Nausea or vomiting  Post vital signs: Reviewed and stable  Last Vitals:  Filed Vitals:   03/19/15 0904  BP: 149/89  Pulse: 100  Temp:   Resp: 12    Level of consciousness: awake, alert  and patient cooperative  Complications: No apparent anesthesia complications

## 2015-03-19 NOTE — Op Note (Signed)
Wanda Cervantes, [place] NO.:  [id number]  MEDICAL RECORD NO.:  [id number]  LOCATION:  [facility]                         FACILITY:  [facility]  PHYSICIAN:  Margaretmary Eddy, MD        DATE OF BIRTH:  11/09/1950  DATE OF PROCEDURE:  03/19/2015 DATE OF DISCHARGE:  03/19/2015                              OPERATIVE REPORT   PREOPERATIVE DIAGNOSIS: 1. Left carpal tunnel syndrome. 2. Rheumatoid flexor tenosynovitis, distal left forearm and left     wrist.  POSTOPERATIVE DIAGNOSIS: 1. Left carpal tunnel syndrome. 2. Rheumatoid flexor tenosynovitis, distal left forearm and left     wrist.  PROCEDURE: 1. Left carpal tunnel release. 2. Flexor tenosynovectomy, distal left forearm and wrist.  SURGEON:  Margaretmary Eddy, MD  ANESTHESIA:  General.  COMPLICATIONS:  None.  TOURNIQUET TIME:  49 minutes.  DRAINS:  None.  DESCRIPTION OF PROCEDURE:  A 2 g of Ancef is given intravenously prior to the procedure.  General anesthesia is induced.  The left upper extremity is thoroughly prepped with alcohol and ChloraPrep and draped in standard sterile fashion.  The extremity is wrapped out with the Esmarch bandage and pneumatic tourniquet elevated to 250 mmHg.  A standard volar carpal tunnel incision is made and then this is extended proximally and obliquely across the wrist flexion crease and down along the ulnar aspect.  Under loupe magnification, the dissection is carried down and the median nerve is identified and is tagged with a Penrose drain.  The dissection is then carried out through the carpal tunnel and complete carpal tunnel release is performed.  There is seen to be moderate to severe compression of the nerve directly beneath the ligament.  All of the volar flexor tendons in the distal forearm and the wrist are seen to be intimately involved with flexor tenosynovitis. Under loupe magnification, complete resection of all disease synovium is performed.  No ruptured tendons are  noted.  The wound is thoroughly irrigated multiple times.  A 1 mL of dexamethasone is infiltrated into the area around the tendon.  The skin is closed with 4-0 nylon.  Soft bulky dressing with a volar splint is applied.  Tourniquet is released, and the patient is returned to the recovery room in satisfactory condition having tolerated the procedure quite well.          ______________________________ Margaretmary Eddy, MD     CS/MEDQ  D:  03/19/2015  T:  03/19/2015  Job:  [id number]

## 2015-03-19 NOTE — Anesthesia Procedure Notes (Signed)
Procedure Name: LMA Insertion Date/Time: 03/19/2015 7:30 AM Performed by: Allean Found Pre-anesthesia Checklist: Patient identified, Emergency Drugs available, Suction available, Patient being monitored and Timeout performed Patient Re-evaluated:Patient Re-evaluated prior to inductionOxygen Delivery Method: Circle system utilized Preoxygenation: Pre-oxygenation with 100% oxygen Intubation Type: IV induction Ventilation: Mask ventilation without difficulty LMA: LMA inserted LMA Size: 4.0 Number of attempts: 1 Tube secured with: Tape Dental Injury: Teeth and Oropharynx as per pre-operative assessment

## 2015-03-19 NOTE — Discharge Instructions (Signed)
AMBULATORY SURGERY  DISCHARGE INSTRUCTIONS   1) The drugs that you were given will stay in your system until tomorrow so for the next 24 hours you should not:  A) Drive an automobile B) Make any legal decisions C) Drink any alcoholic beverage   2) You may resume regular meals tomorrow.  Today it is better to start with liquids and gradually work up to solid foods.  You may eat anything you prefer, but it is better to start with liquids, then soup and crackers, and gradually work up to solid foods.   3) Please notify your doctor immediately if you have any unusual bleeding, trouble breathing, redness and pain at the surgery site, drainage, fever, or pain not relieved by medication.    4) Additional Instructions:  Elevate hand at all times       Keep splint clean and dry      Please contact your physician with any problems or Same Day Surgery at [phone], Monday through Friday 6 am to 4 pm, or [place] at [facility] number at [phone].

## 2015-03-23 ENCOUNTER — Other Ambulatory Visit: Payer: Self-pay

## 2015-03-23 ENCOUNTER — Emergency Department
Admission: EM | Admit: 2015-03-23 | Discharge: 2015-03-23 | Disposition: A | Discharge status: Home / Self Care | Payer: Medicare HMO | Attending: Emergency Medicine | Admitting: Emergency Medicine

## 2015-03-23 ENCOUNTER — Emergency Department: Payer: Medicare HMO

## 2015-03-23 DIAGNOSIS — Z720 Tobacco use: Secondary | ICD-10-CM | POA: Insufficient documentation

## 2015-03-23 DIAGNOSIS — Z79899 Other long term (current) drug therapy: Secondary | ICD-10-CM | POA: Diagnosis not present

## 2015-03-23 DIAGNOSIS — K5732 Diverticulitis of large intestine without perforation or abscess without bleeding: Secondary | ICD-10-CM

## 2015-03-23 DIAGNOSIS — R1032 Left lower quadrant pain: Secondary | ICD-10-CM | POA: Diagnosis present

## 2015-03-23 LAB — URINALYSIS COMPLETE WITH MICROSCOPIC (ARMC ONLY)
Bilirubin Urine: NEGATIVE
Glucose, UA: NEGATIVE mg/dL
Ketones, ur: NEGATIVE mg/dL
Leukocytes, UA: NEGATIVE
Nitrite: NEGATIVE
PH: 6 (ref 5.0–8.0)
Protein, ur: NEGATIVE mg/dL
Specific Gravity, Urine: 1.005 (ref 1.005–1.030)

## 2015-03-23 LAB — COMPREHENSIVE METABOLIC PANEL
ALBUMIN: 4 g/dL (ref 3.5–5.0)
ALK PHOS: 93 U/L (ref 38–126)
ALT: 19 U/L (ref 14–54)
AST: 15 U/L (ref 15–41)
Anion gap: 8 (ref 5–15)
BUN: 13 mg/dL (ref 6–20)
CO2: 27 mmol/L (ref 22–32)
CREATININE: 0.46 mg/dL (ref 0.44–1.00)
Calcium: 8.9 mg/dL (ref 8.9–10.3)
Chloride: 102 mmol/L (ref 101–111)
GFR calc Af Amer: >60 mL/min (ref >60)
Glucose, Bld: 108 mg/dL — ABNORMAL HIGH (ref 65–99)
POTASSIUM: 3.8 mmol/L (ref 3.5–5.1)
SODIUM: 137 mmol/L (ref 135–145)
TOTAL PROTEIN: 7 g/dL (ref 6.5–8.1)
Total Bilirubin: 0.8 mg/dL (ref 0.3–1.2)

## 2015-03-23 LAB — CBC
HCT: 40.8 % (ref 35.0–47.0)
Hemoglobin: 13.8 g/dL (ref 12.0–16.0)
MCH: 30.4 pg (ref 26.0–34.0)
MCHC: 33.8 g/dL (ref 32.0–36.0)
MCV: 90 fL (ref 80.0–100.0)
PLATELETS: 276 10*3/uL (ref 150–440)
RBC: 4.53 MIL/uL (ref 3.80–5.20)
RDW: 14.3 % (ref 11.5–14.5)
WBC: 14.7 10*3/uL — ABNORMAL HIGH (ref 3.6–11.0)

## 2015-03-23 LAB — LIPASE, BLOOD: Lipase: 26 U/L (ref 22–51)

## 2015-03-23 MED ORDER — IOHEXOL 300 MG/ML  SOLN
100.0000 mL | Freq: Once | INTRAMUSCULAR | Status: AC | PRN
  Administered 2015-03-23: 100 mL via INTRAVENOUS

## 2015-03-23 MED ORDER — IOHEXOL 240 MG/ML SOLN
25.0000 mL | Freq: Once | INTRAMUSCULAR | Status: AC | PRN
  Administered 2015-03-23: 25 mL via ORAL

## 2015-03-23 MED ORDER — METRONIDAZOLE 500 MG PO TABS
500.0000 mg | ORAL_TABLET | Freq: Once | ORAL | Status: AC
  Administered 2015-03-23: 500 mg via ORAL
  Filled 2015-03-23: qty 1

## 2015-03-23 MED ORDER — CIPROFLOXACIN HCL 500 MG PO TABS: 500.0000 mg | ORAL_TABLET | Freq: Two times a day (BID) | ORAL | Status: DC

## 2015-03-23 MED ORDER — SODIUM CHLORIDE 0.9 % IV BOLUS (SEPSIS)
1000.0000 mL | Freq: Once | INTRAVENOUS | Status: AC
  Administered 2015-03-23: 1000 mL via INTRAVENOUS

## 2015-03-23 MED ORDER — METRONIDAZOLE 500 MG PO TABS: 500.0000 mg | ORAL_TABLET | Freq: Three times a day (TID) | ORAL | Status: AC

## 2015-03-23 MED ORDER — CIPROFLOXACIN HCL 500 MG PO TABS
500.0000 mg | ORAL_TABLET | Freq: Once | ORAL | Status: AC
  Administered 2015-03-23: 500 mg via ORAL
  Filled 2015-03-23: qty 1

## 2015-03-23 NOTE — ED Provider Notes (Addendum)
[facility] Emergency Department Provider Note  ____________________________________________  Time seen: Approximately 550 PM  I have reviewed the triage vital signs and the nursing notes.   HISTORY  Chief Complaint Abdominal Pain    HPI Wanda Cervantes is a 64 y.o. female with a recent history of carpal tunnel syndrome who was presenting today with left lower quadrant abdominal pain over the past day.she denies any nausea vomiting or diarrhea. She may have diverticulitis because of this condition and her sister as well as eating macadamia nuts last night. No fevers at home.   Past Medical History  Diagnosis Date  . Anxiety   . Fibromyalgia   . Arthritis   . Anemia   . Rheumatoid arthritis   . GERD (gastroesophageal reflux disease)   . Hyperlipidemia   . Arthritis     Dr. Ruthell Rummage  . Osteopenia   . Psoriasis with pustules   . Heart murmur     Patient Active Problem List   Diagnosis Date Noted  . Medicare annual wellness visit, subsequent 03/15/2015  . Muscle cramp 01/28/2014  . Screening for colon cancer 01/28/2014  . Post herpetic neuralgia 07/09/2013  . Rheumatoid arthritis(714.0) 05/10/2012  . Screening for breast cancer 05/10/2012  . Familial hyperlipidemia 05/10/2012    Past Surgical History  Procedure Laterality Date  . Abdominal hysterectomy    . Eye surgery      age 79  . Carpal tunnel release Bilateral   . Carpal tunnel release Right 01/15/2015    Procedure: CARPAL TUNNEL RELEASE, SYNOVECTOMY;  Surgeon: Christophe Louis, MD;  Location: [facility] ORS;  Service: Orthopedics;  Laterality: Right;  . Abdominal hysterectomy  1980  . Carpal tunnel release  2016    both hands, revision 2016  . Eye surgery      both eyes  . Carpal tunnel release Left 03/19/2015    Procedure: CARPAL TUNNEL RELEASE;  Surgeon: Christophe Louis, MD;  Location: [facility] ORS;  Service: Orthopedics;  Laterality: Left;  . Ulnar nerve transposition Left 03/19/2015     Procedure: ULNAR NERVE DECOMPRESSION/TRANSPOSITION;  Surgeon: Christophe Louis, MD;  Location: [facility] ORS;  Service: Orthopedics;  Laterality: Left;    Current Outpatient Rx  Name  Route  Sig  Dispense  Refill  . cyanocobalamin (,VITAMIN B-12,) 1000 MCG/ML injection   Intramuscular   Inject 1,000 mcg into the muscle every 30 (thirty) days.          . naproxen sodium (ANAPROX) 220 MG tablet   Oral   Take 440 mg by mouth 2 (two) times daily as needed (for pain).         Marland Kitchen oxyCODONE-acetaminophen (PERCOCET) 10-325 MG per tablet   Oral   Take 1 tablet by mouth every 4 (four) hours as needed for pain.   30 tablet   0   . TURMERIC PO   Oral   Take 1 tablet by mouth daily.           Allergies Aspirin; Cimzia; Norco; Statins; and Zantac  Family History  Problem Relation Age of Onset  . Arthritis Mother   . Cancer Mother     breast  . Hyperlipidemia Mother   . Heart disease Mother   . Hypertension Mother   . Sudden death Mother   . Mental retardation Mother   . Arthritis Father   . Hyperlipidemia Father   . Heart disease Father   . Hypertension Father   . Mental retardation Father     Social  History History  Substance Use Topics  . Smoking status: Current Every Day Smoker -- 0.25 packs/day for 40 years    Types: Cigarettes  . Smokeless tobacco: Not on file  . Alcohol Use: Yes     Comment: 3-4 glasses wine/day    Review of Systems Constitutional: No fever/chills Eyes: No visual changes. ENT: No sore throat. Cardiovascular: Denies chest pain. Respiratory: Denies shortness of breath. Gastrointestinal: No nausea, no vomiting.  No diarrhea.  No constipation. Genitourinary: Negative for dysuria. Musculoskeletal: Negative for back pain. Skin: Negative for rash. Neurological: Negative for headaches, focal weakness or numbness.  10-point ROS otherwise negative.  ____________________________________________   PHYSICAL EXAM:  VITAL SIGNS: ED Triage Vitals   Enc Vitals Group     BP 03/23/15 1353 131/62 mmHg     Pulse Rate 03/23/15 1353 90     Resp 03/23/15 1353 16     Temp 03/23/15 1353 98.3 F (36.8 C)     Temp Source 03/23/15 1353 Oral     SpO2 03/23/15 1353 99 %     Weight 03/23/15 1353 159 lb (72.122 kg)     Height 03/23/15 1353 5\' 2"  (1.575 m)     Head Cir --      Peak Flow --      Pain Score 03/23/15 1354 4     Pain Loc --      Pain Edu? --      Excl. in [place]? --     Constitutional: Alert and oriented. Well appearing and in no acute distress. Eyes: Conjunctivae are normal. PERRL. EOMI. Head: Atraumatic. Nose: No congestion/rhinnorhea. Mouth/Throat: Mucous membranes are moist.  Oropharynx non-erythematous. Neck: No stridor.   Cardiovascular: Normal rate, regular rhythm. Grossly normal heart sounds.  Good peripheral circulation. Respiratory: Normal respiratory effort.  No retractions. Lungs CTAB. Gastrointestinal: Soft with left lower quadrant abdominal pain. There is no rebound or guarding. No distention. No abdominal bruits. No CVA tenderness. Musculoskeletal: No lower extremity tenderness nor edema.  No joint effusions. Neurologic:  Normal speech and language. No gross focal neurologic deficits are appreciated. No gait instability. Skin:  Skin is warm, dry and intact. No rash noted. Psychiatric: Mood and affect are normal. Speech and behavior are normal.  ____________________________________________   LABS (all labs ordered are listed, but only abnormal results are displayed)  Labs Reviewed  COMPREHENSIVE METABOLIC PANEL - Abnormal; Notable for the following:    Glucose, Bld 108 (*)    All other components within normal limits  CBC - Abnormal; Notable for the following:    WBC 14.7 (*)    All other components within normal limits  URINALYSIS COMPLETEWITH MICROSCOPIC ([facility] ONLY) - Abnormal; Notable for the following:    Color, Urine STRAW (*)    APPearance CLEAR (*)    Hgb urine dipstick 1+ (*)    Bacteria, UA FEW  (*)    Squamous Epithelial / LPF 0-5 (*)    All other components within normal limits  LIPASE, BLOOD   ____________________________________________  EKG  ED ECG REPORT I, Doran Stabler, the attending physician, personally viewed and interpreted this ECG.   Date: 03/23/2015  EKG Time: 1402  Rate: 89  Rhythm: normal sinus rhythm  Axis: normal axis  Intervals:none  ST&T Change: no ST elevations or depressions. No abnormal T-wave inversions.  ____________________________________________  RADIOLOGY  Inflammation in the region of the sigmoid colon consistent with acute diverticulitis. No abscess or free air. ____________________________________________   PROCEDURES    ____________________________________________  INITIAL IMPRESSION / ASSESSMENT AND PLAN / ED COURSE  Pertinent labs & imaging results that were available during my care of the patient were reviewed by me and considered in my medical decision making (see chart for details).  ----------------------------------------- 8:18 PM on 03/23/2015 -----------------------------------------  Patient resting comfortably at this time. We'll discharge with Cipro Flagyl and tramadol. Will follow up with primary care doctor. ____________________________________________   FINAL CLINICAL IMPRESSION(S) / ED DIAGNOSES  Acute, uncomplicated diverticulitis. Initial visit.    Orbie Pyo, MD 03/23/15 2019  Patient says she has oxycodone at home that is left over from her carpal tunnel surgery and will use this for pain control.  Orbie Pyo, MD 03/23/15 [phone]

## 2015-03-23 NOTE — ED Notes (Signed)
Pt brought over from [facility] w c/o of LLQ abd pain. Pt denies n/v/d.

## 2015-03-23 NOTE — Discharge Instructions (Signed)
Diverticulitis °Diverticulitis is when small pockets that have formed in your colon (large intestine) become infected or swollen. °HOME CARE °· Follow your doctor's instructions. °· Follow a special diet if told by your doctor. °· When you feel better, your doctor may tell you to change your diet. You may be told to eat a lot of fiber. Fruits and vegetables are good sources of fiber. Fiber makes it easier to poop (have bowel movements). °· Take supplements or probiotics as told by your doctor. °· Only take medicines as told by your doctor. °· Keep all follow-up visits with your doctor. °GET HELP IF: °· Your pain does not get better. °· You have a hard time eating food. °· You are not pooping like normal. °GET HELP RIGHT AWAY IF: °· Your pain gets worse. °· Your problems do not get better. °· Your problems suddenly get worse. °· You have a fever. °· You keep throwing up (vomiting). °· You have bloody or black, tarry poop (stool). °MAKE SURE YOU:  °· Understand these instructions. °· Will watch your condition. °· Will get help right away if you are not doing well or get worse. °Document Released: 01/24/2008 Document Revised: 08/12/2013 Document Reviewed: 07/02/2013 °Exit[employer]® Patient Information ©2015 [employer]. This information is not intended to replace advice given to you by your health care provider. Make sure you discuss any questions you have with your health care provider. ° °

## 2015-03-23 NOTE — ED Notes (Signed)
Patient with no complaints at this time. Respirations even and unlabored. Skin warm/dry. Discharge instructions reviewed with patient at this time. Patient given opportunity to voice concerns/ask questions. IV removed per policy and band-aid applied to site. Patient discharged at this time and left Emergency Department, via wheelchair.   

## 2015-03-23 NOTE — ED Notes (Signed)
Pt presents c/o pain in her llq since this morning. Pt states she ate 5 macadamia nuts last night and wonders if she might have diverticulitis (sister has it). Pt states she had pain like this once before, not quite as intense, and it resolved after a few days without treatment. Pt is alert & oriented with warm, dry skin and NAD.

## 2015-03-30 ENCOUNTER — Ambulatory Visit (INDEPENDENT_AMBULATORY_CARE_PROVIDER_SITE_OTHER): Payer: Medicare HMO | Admitting: Nurse Practitioner

## 2015-03-30 VITALS — BP 128/84 | HR 92 | Temp 97.7°F | Resp 16 | Ht 62.0 in | Wt 159.8 lb

## 2015-03-30 DIAGNOSIS — K5732 Diverticulitis of large intestine without perforation or abscess without bleeding: Secondary | ICD-10-CM | POA: Diagnosis not present

## 2015-03-30 NOTE — Progress Notes (Signed)
Patient ID: Wanda Cervantes, female    DOB: Sep 21, 1950  Age: 64 y.o. MRN: [id number]  CC: Follow-up   HPI Wanda Cervantes presents for ER follow up on diverticulitis.   1) On 03/23/15 went to ED. Scared to drive, took her to the walk in clinic. Lower abdominal pain and left side pain.   Aleve- helpful  Cipro/Flagyl- helpful  Water only during this Feels nausea with the antibiotics denies diarrhea   Not much meat, no red meat in over a month,  Eating kale, spinach, and romaine  Denies constipation, soft stool 2-3 x a day  History Wanda Cervantes has a past medical history of Anxiety; Fibromyalgia; Arthritis; Anemia; Rheumatoid arthritis; GERD (gastroesophageal reflux disease); Hyperlipidemia; Arthritis; Osteopenia; Psoriasis with pustules; and Heart murmur.   She has past surgical history that includes Abdominal hysterectomy; Eye surgery; Carpal tunnel release (Bilateral); Carpal tunnel release (Right, 01/15/2015); Abdominal hysterectomy (1980); Carpal tunnel release (2016); Eye surgery; Carpal tunnel release (Left, 03/19/2015); and Ulnar nerve transposition (Left, 03/19/2015).   Her family history includes Arthritis in her father and mother; Cancer in her mother; Heart disease in her father and mother; Hyperlipidemia in her father and mother; Hypertension in her father and mother; Mental retardation in her father and mother; Sudden death in her mother.She reports that she has been smoking Cigarettes.  She has a 10 pack-year smoking history. She does not have any smokeless tobacco history on file. She reports that she drinks alcohol. She reports that she does not use illicit drugs.  Outpatient Prescriptions Prior to Visit  Medication Sig Dispense Refill  . ciprofloxacin (CIPRO) 500 MG tablet Take 1 tablet (500 mg total) by mouth 2 (two) times daily. 20 tablet 0  . cyanocobalamin (,VITAMIN B-12,) 1000 MCG/ML injection Inject 1,000 mcg into the muscle every 30 (thirty) days.     . metroNIDAZOLE (FLAGYL) 500  MG tablet Take 1 tablet (500 mg total) by mouth 3 (three) times daily. 30 tablet 0  . naproxen sodium (ANAPROX) 220 MG tablet Take 440 mg by mouth 2 (two) times daily as needed (for pain).    Marland Kitchen oxyCODONE-acetaminophen (PERCOCET) 10-325 MG per tablet Take 1 tablet by mouth every 4 (four) hours as needed for pain. (Patient not taking: Reported on 03/30/2015) 30 tablet 0  . TURMERIC PO Take 1 tablet by mouth daily.     No facility-administered medications prior to visit.   ROS Review of Systems  Constitutional: Negative for fever, chills, diaphoresis and fatigue.  Respiratory: Negative for chest tightness, shortness of breath and wheezing.   Cardiovascular: Negative for chest pain, palpitations and leg swelling.  Gastrointestinal: Negative for nausea, vomiting, abdominal pain, diarrhea and abdominal distention.  Skin: Negative for rash.  Neurological: Negative for dizziness, weakness, numbness and headaches.  Psychiatric/Behavioral: The patient is not nervous/anxious.    Objective:  BP 128/84 mmHg  Pulse 92  Temp(Src) 97.7 F (36.5 C)  Resp 16  Ht 5\' 2"  (1.575 m)  Wt 159 lb 12.8 oz (72.485 kg)  BMI 29.22 kg/m2  SpO2 96%  Physical Exam  Constitutional: She is oriented to person, place, and time. She appears well-developed and well-nourished. No distress.  HENT:  Head: Normocephalic and atraumatic.  Right Ear: External ear normal.  Left Ear: External ear normal.  Cardiovascular: Normal rate, regular rhythm and normal heart sounds.  Exam reveals no gallop and no friction rub.   No murmur heard. Pulmonary/Chest: Effort normal and breath sounds normal. No respiratory distress. She has no wheezes. She  has no rales. She exhibits no tenderness.  Abdominal: Soft. Bowel sounds are normal. She exhibits no distension and no mass. There is no tenderness. There is no rebound and no guarding.  Neurological: She is alert and oriented to person, place, and time. No cranial nerve deficit. She exhibits  normal muscle tone. Coordination normal.  Skin: Skin is warm and dry. No rash noted. She is not diaphoretic.  Psychiatric: She has a normal mood and affect. Her behavior is normal. Judgment and thought content normal.   Assessment & Plan:   There are no diagnoses linked to this encounter. I am having Ms. Traxler maintain her cyanocobalamin, TURMERIC PO, oxyCODONE-acetaminophen, naproxen sodium, ciprofloxacin, and metroNIDAZOLE.  No orders of the defined types were placed in this encounter.     Follow-up: No Follow-up on file.

## 2015-03-30 NOTE — Patient Instructions (Addendum)
Please take a probiotic ( Align, Floraque or Culturelle) while you are on the antibiotic to prevent a serious antibiotic associated diarrhea  Called clostirudium dificile colitis and a vaginal yeast infection.   Diverticulosis Diverticulosis is the condition that develops when small pouches (diverticula) form in the wall of your colon. Your colon, or large intestine, is where water is absorbed and stool is formed. The pouches form when the inside layer of your colon pushes through weak spots in the outer layers of your colon. CAUSES  No one knows exactly what causes diverticulosis. RISK FACTORS  Being older than 37. Your risk for this condition increases with age. Diverticulosis is rare in people younger than 40 years. By age 57, almost everyone has it.  Eating a low-fiber diet.   Being frequently constipated.  Being overweight.  Not getting enough exercise.  Smoking.  Taking over-the-counter pain medicines, like aspirin and ibuprofen. SYMPTOMS  Most people with diverticulosis do not have symptoms. DIAGNOSIS  Because diverticulosis often has no symptoms, health care providers often discover the condition during an exam for other colon problems. In many cases, a health care provider will diagnose diverticulosis while using a flexible scope to examine the colon (colonoscopy). TREATMENT  If you have never developed an infection related to diverticulosis, you may not need treatment. If you have had an infection before, treatment may include:  Eating more fruits, vegetables, and grains.  Taking a fiber supplement.   Taking a live bacteria supplement (probiotic).  Taking medicine to relax your colon. HOME CARE INSTRUCTIONS   Drink at least 6-8 glasses of water each day to prevent constipation.  Try not to strain when you have a bowel movement.  Keep all follow-up appointments. If you have had an infection before:  Increase the fiber in your diet as directed by your health care  provider or dietitian.  Take a dietary fiber supplement if your health care provider approves.  Only take medicines as directed by your health care provider. SEEK MEDICAL CARE IF:   You have abdominal pain.  You have bloating.  You have cramps.  You have not gone to the bathroom in 3 days. SEEK IMMEDIATE MEDICAL CARE IF:   Your pain gets worse.  Yourbloating becomes very bad.  You have a fever or chills, and your symptoms suddenly get worse.  You begin vomiting.  You have bowel movements that are bloody or black. MAKE SURE YOU:  Understand these instructions.  Will watch your condition.  Will get help right away if you are not doing well or get worse. Document Released: 05/04/2004 Document Revised: 08/12/2013 Document Reviewed: 07/02/2013 [facility] Patient Information 2015 [place], [place]. This information is not intended to replace advice given to you by your health care provider. Make sure you discuss any questions you have with your health care provider.

## 2015-03-30 NOTE — Progress Notes (Signed)
Pre visit review using our clinic review tool, if applicable. No additional management support is needed unless otherwise documented below in the visit note. 

## 2015-04-01 ENCOUNTER — Encounter: Payer: Self-pay | Admitting: Nurse Practitioner

## 2015-04-01 DIAGNOSIS — K5732 Diverticulitis of large intestine without perforation or abscess without bleeding: Secondary | ICD-10-CM | POA: Insufficient documentation

## 2015-04-01 NOTE — Assessment & Plan Note (Addendum)
03/23/15 pt seen in ED for Diverticulitis. WBC was elevated, other labs normal, UA normal, EKG normal, CT of abdomen- significant diverticulosis with diverticulitis of the large intestine w.out abscess or perforation.   Gave pt handout and reviewed about diverticulosis vs. Diverticulitis and dietary habits that may be helpful (increased fiber along with exercise). Pt questions answered to satisfaction, pt will stay on meds until finished.Encouraged pt to get a colonoscopy!

## 2015-05-26 ENCOUNTER — Ambulatory Visit (INDEPENDENT_AMBULATORY_CARE_PROVIDER_SITE_OTHER): Payer: Medicare HMO

## 2015-05-26 DIAGNOSIS — Z23 Encounter for immunization: Secondary | ICD-10-CM

## 2015-05-26 DIAGNOSIS — E538 Deficiency of other specified B group vitamins: Secondary | ICD-10-CM

## 2015-05-26 MED ORDER — CYANOCOBALAMIN 1000 MCG/ML IJ SOLN
1000.0000 ug | Freq: Once | INTRAMUSCULAR | Status: AC
  Administered 2015-05-26: 1000 ug via INTRAMUSCULAR

## 2015-05-26 NOTE — Progress Notes (Signed)
Patient came in for B12 injection and flu shot.  Received b12 in left deltoid and flu shot in Right deltoid.  Patient tolerated well.

## 2015-06-11 ENCOUNTER — Encounter: Payer: Self-pay | Admitting: *Deleted

## 2015-06-11 ENCOUNTER — Ambulatory Visit
Admission: RE | Admit: 2015-06-11 | Discharge: 2015-06-11 | Disposition: A | Discharge status: Home / Self Care | Payer: Medicare HMO | Source: Ambulatory Visit | Attending: Unknown Physician Specialty | Admitting: Unknown Physician Specialty

## 2015-06-11 ENCOUNTER — Ambulatory Visit: Payer: Medicare HMO | Admitting: Anesthesiology

## 2015-06-11 ENCOUNTER — Encounter
Admission: RE | Disposition: A | Discharge status: Home / Self Care | Payer: Self-pay | Source: Ambulatory Visit | Attending: Unknown Physician Specialty

## 2015-06-11 DIAGNOSIS — R69 Illness, unspecified: Secondary | ICD-10-CM | POA: Diagnosis not present

## 2015-06-11 DIAGNOSIS — Z886 Allergy status to analgesic agent status: Secondary | ICD-10-CM | POA: Diagnosis not present

## 2015-06-11 DIAGNOSIS — F17210 Nicotine dependence, cigarettes, uncomplicated: Secondary | ICD-10-CM | POA: Insufficient documentation

## 2015-06-11 DIAGNOSIS — F419 Anxiety disorder, unspecified: Secondary | ICD-10-CM | POA: Insufficient documentation

## 2015-06-11 DIAGNOSIS — Z8261 Family history of arthritis: Secondary | ICD-10-CM | POA: Insufficient documentation

## 2015-06-11 DIAGNOSIS — K5730 Diverticulosis of large intestine without perforation or abscess without bleeding: Secondary | ICD-10-CM | POA: Insufficient documentation

## 2015-06-11 DIAGNOSIS — Z885 Allergy status to narcotic agent status: Secondary | ICD-10-CM | POA: Insufficient documentation

## 2015-06-11 DIAGNOSIS — M069 Rheumatoid arthritis, unspecified: Secondary | ICD-10-CM | POA: Diagnosis not present

## 2015-06-11 DIAGNOSIS — K219 Gastro-esophageal reflux disease without esophagitis: Secondary | ICD-10-CM | POA: Insufficient documentation

## 2015-06-11 DIAGNOSIS — Z810 Family history of intellectual disabilities: Secondary | ICD-10-CM | POA: Insufficient documentation

## 2015-06-11 DIAGNOSIS — M8580 Other specified disorders of bone density and structure, unspecified site: Secondary | ICD-10-CM | POA: Diagnosis not present

## 2015-06-11 DIAGNOSIS — M797 Fibromyalgia: Secondary | ICD-10-CM | POA: Insufficient documentation

## 2015-06-11 DIAGNOSIS — K635 Polyp of colon: Secondary | ICD-10-CM | POA: Diagnosis not present

## 2015-06-11 DIAGNOSIS — Z888 Allergy status to other drugs, medicaments and biological substances status: Secondary | ICD-10-CM | POA: Insufficient documentation

## 2015-06-11 DIAGNOSIS — E785 Hyperlipidemia, unspecified: Secondary | ICD-10-CM | POA: Insufficient documentation

## 2015-06-11 DIAGNOSIS — Z79899 Other long term (current) drug therapy: Secondary | ICD-10-CM | POA: Diagnosis not present

## 2015-06-11 DIAGNOSIS — D120 Benign neoplasm of cecum: Secondary | ICD-10-CM | POA: Diagnosis not present

## 2015-06-11 DIAGNOSIS — Z8249 Family history of ischemic heart disease and other diseases of the circulatory system: Secondary | ICD-10-CM | POA: Diagnosis not present

## 2015-06-11 DIAGNOSIS — Z1211 Encounter for screening for malignant neoplasm of colon: Secondary | ICD-10-CM | POA: Insufficient documentation

## 2015-06-11 DIAGNOSIS — Z90710 Acquired absence of both cervix and uterus: Secondary | ICD-10-CM | POA: Insufficient documentation

## 2015-06-11 DIAGNOSIS — R011 Cardiac murmur, unspecified: Secondary | ICD-10-CM | POA: Insufficient documentation

## 2015-06-11 DIAGNOSIS — Z803 Family history of malignant neoplasm of breast: Secondary | ICD-10-CM | POA: Diagnosis not present

## 2015-06-11 DIAGNOSIS — D122 Benign neoplasm of ascending colon: Secondary | ICD-10-CM | POA: Insufficient documentation

## 2015-06-11 DIAGNOSIS — K640 First degree hemorrhoids: Secondary | ICD-10-CM | POA: Diagnosis not present

## 2015-06-11 DIAGNOSIS — D649 Anemia, unspecified: Secondary | ICD-10-CM | POA: Diagnosis not present

## 2015-06-11 DIAGNOSIS — L409 Psoriasis, unspecified: Secondary | ICD-10-CM | POA: Diagnosis not present

## 2015-06-11 HISTORY — PX: COLONOSCOPY WITH PROPOFOL: SHX5780

## 2015-06-11 SURGERY — COLONOSCOPY WITH PROPOFOL
Anesthesia: General

## 2015-06-11 MED ORDER — PROPOFOL 10 MG/ML IV BOLUS
INTRAVENOUS | Status: DC | PRN
  Administered 2015-06-11: 50 mg via INTRAVENOUS
  Administered 2015-06-11: 20 mg via INTRAVENOUS

## 2015-06-11 MED ORDER — PROPOFOL 500 MG/50ML IV EMUL
INTRAVENOUS | Status: DC | PRN
  Administered 2015-06-11: 140 ug/kg/min via INTRAVENOUS
  Administered 2015-06-11: 160 ug/kg/min via INTRAVENOUS

## 2015-06-11 MED ORDER — FENTANYL CITRATE (PF) 100 MCG/2ML IJ SOLN
INTRAMUSCULAR | Status: DC | PRN
  Administered 2015-06-11 (×2): 50 ug via INTRAVENOUS

## 2015-06-11 MED ORDER — MIDAZOLAM HCL 2 MG/2ML IJ SOLN
INTRAMUSCULAR | Status: DC | PRN
  Administered 2015-06-11: 2 mg via INTRAVENOUS

## 2015-06-11 MED ORDER — SODIUM CHLORIDE 0.9 % IV SOLN: INTRAVENOUS | Status: DC

## 2015-06-11 MED ORDER — PHENYLEPHRINE HCL 10 MG/ML IJ SOLN
INTRAMUSCULAR | Status: DC | PRN
  Administered 2015-06-11 (×2): 100 ug via INTRAVENOUS

## 2015-06-11 MED ORDER — SODIUM CHLORIDE 0.9 % IV SOLN
INTRAVENOUS | Status: DC
  Administered 2015-06-11: 1000 mL via INTRAVENOUS

## 2015-06-11 NOTE — H&P (Signed)
Primary Care Physician:  Rica Mast, MD Primary Gastroenterologist:  Dr. Vira Agar  Pre-Procedure History & Physical: HPI:  Wanda Cervantes is a 65 y.o. female is here for an colonoscopy.   Past Medical History  Diagnosis Date  . Anxiety   . Fibromyalgia   . Arthritis   . Anemia   . Rheumatoid arthritis ([place])   . GERD (gastroesophageal reflux disease)   . Hyperlipidemia   . Arthritis     Dr. Ruthell Rummage  . Osteopenia   . Psoriasis with pustules   . Heart murmur     Past Surgical History  Procedure Laterality Date  . Abdominal hysterectomy    . Eye surgery      age 6  . Carpal tunnel release Bilateral   . Carpal tunnel release Right 01/15/2015    Procedure: CARPAL TUNNEL RELEASE, SYNOVECTOMY;  Surgeon: Christophe Louis, MD;  Location: [facility] ORS;  Service: Orthopedics;  Laterality: Right;  . Abdominal hysterectomy  1980  . Carpal tunnel release  2016    both hands, revision 2016  . Eye surgery      both eyes  . Carpal tunnel release Left 03/19/2015    Procedure: CARPAL TUNNEL RELEASE;  Surgeon: Christophe Louis, MD;  Location: [facility] ORS;  Service: Orthopedics;  Laterality: Left;  . Ulnar nerve transposition Left 03/19/2015    Procedure: ULNAR NERVE DECOMPRESSION/TRANSPOSITION;  Surgeon: Christophe Louis, MD;  Location: [facility] ORS;  Service: Orthopedics;  Laterality: Left;    Prior to Admission medications   Medication Sig Start Date End Date Taking? Authorizing Provider  ciprofloxacin (CIPRO) 500 MG tablet Take 1 tablet (500 mg total) by mouth 2 (two) times daily. 03/23/15  Yes Orbie Pyo, MD  naproxen sodium (ANAPROX) 220 MG tablet Take 440 mg by mouth 2 (two) times daily as needed (for pain).   Yes Historical Provider, MD  oxyCODONE-acetaminophen (PERCOCET) 10-325 MG per tablet Take 1 tablet by mouth every 4 (four) hours as needed for pain. 01/15/15  Yes Christophe Louis, MD  TURMERIC PO Take 1 tablet by mouth daily.   Yes Historical Provider, MD   cyanocobalamin (,VITAMIN B-12,) 1000 MCG/ML injection Inject 1,000 mcg into the muscle every 30 (thirty) days.     Historical Provider, MD    Allergies as of 04/21/2015 - Review Complete 03/30/2015  Allergen Reaction Noted  . Aspirin Other (See Comments) 01/14/2015  . Cimzia [certolizumab pegol] Hives 05/10/2012  . Norco [hydrocodone-acetaminophen] Other (See Comments) 01/11/2015  . Statins Other (See Comments) 05/10/2012  . Zantac [ranitidine hcl] Nausea And Vomiting 01/07/2015    Family History  Problem Relation Age of Onset  . Arthritis Mother   . Cancer Mother     breast  . Hyperlipidemia Mother   . Heart disease Mother   . Hypertension Mother   . Sudden death Mother   . Mental retardation Mother   . Arthritis Father   . Hyperlipidemia Father   . Heart disease Father   . Hypertension Father   . Mental retardation Father     Social History   Social History  . Marital Status: Married    Spouse Name: N/A  . Number of Children: N/A  . Years of Education: N/A   Occupational History  . Not on file.   Social History Main Topics  . Smoking status: Current Every Day Smoker -- 0.25 packs/day for 40 years    Types: Cigarettes  . Smokeless tobacco: Not on file  . Alcohol Use: Yes  Comment: 3-4 glasses wine/day  . Drug Use: No  . Sexual Activity: Not on file   Other Topics Concern  . Not on file   Social History Narrative   ** Merged History Encounter **       Lives with husband in [place]: See HPI, otherwise negative ROS  Physical Exam: BP 152/78 mmHg  Pulse 104  Temp(Src) 98.4 F (36.9 C) (Oral)  Resp 20  SpO2 100% General:   Alert,  pleasant and cooperative in NAD Head:  Normocephalic and atraumatic. Neck:  Supple; no masses or thyromegaly. Lungs:  Clear throughout to auscultation.    Heart:  Regular rate and rhythm. Abdomen:  Soft, nontender and nondistended. Normal bowel sounds, without guarding, and without rebound.    Neurologic:  Alert and  oriented x4;  grossly normal neurologically.  Impression/Plan: Wanda Cervantes is here for an colonoscopy to be performed for screening  Risks, benefits, limitations, and alternatives regarding  colonoscopy have been reviewed with the patient.  Questions have been answered.  All parties agreeable.   Gaylyn Cheers, MD  06/11/2015, 2:59 PM

## 2015-06-11 NOTE — Transfer of Care (Signed)
Immediate Anesthesia Transfer of Care Note  Patient: Wanda Cervantes  Procedure(s) Performed: Procedure(s): COLONOSCOPY WITH PROPOFOL (N/A)  Patient Location: PACU  Anesthesia Type:General  Level of Consciousness: sedated  Airway & Oxygen Therapy: Patient Spontanous Breathing and Patient connected to face mask oxygen  Post-op Assessment: Report given to RN and Post -op Vital signs reviewed and stable  Post vital signs: Reviewed and stable  Last Vitals:  Filed Vitals:   06/11/15 1546  BP: 109/60  Pulse:   Temp: 35.9 C  Resp: 16    Complications: No apparent anesthesia complications

## 2015-06-11 NOTE — Op Note (Signed)
[facility] Gastroenterology Patient Name: Elvis Boot Procedure Date: 06/11/2015 2:57 PM MRN: [id number] Account #: [id number] Date of Birth: 1951/06/10 Admit Type: Outpatient Age: 64 Room: [facility] ENDO ROOM 1 Gender: Female Note Status: Finalized Procedure:         Colonoscopy Indications:       Screening for colorectal malignant neoplasm Providers:         Manya Silvas, MD Referring MD:      Eduard Clos. Gilford Rile, MD (Referring MD) Medicines:         Propofol per Anesthesia Complications:     No immediate complications. Procedure:         Pre-Anesthesia Assessment:                    - After reviewing the risks and benefits, the patient was                     deemed in satisfactory condition to undergo the procedure.                    After obtaining informed consent, the colonoscope was                     passed under direct vision. Throughout the procedure, the                     patient's blood pressure, pulse, and oxygen saturations                     were monitored continuously. The Colonoscope was                     introduced through the anus and advanced to the the cecum,                     identified by appendiceal orifice and ileocecal valve. The                     colonoscopy was performed without difficulty. The patient                     tolerated the procedure well. The quality of the bowel                     preparation was good. Findings:      A diminutive polyp was found in the cecum. The polyp was sessile. The       polyp was removed with a cold biopsy forceps. Resection and retrieval       were complete.      A small polyp was found in the ascending colon. The polyp was sessile.       The polyp was removed with a hot snare. Resection and retrieval were       complete.      A medium polyp was found in the ascending colon. The polyp was sessile.       The polyp was removed with a hot snare. Resection and retrieval were   complete. 2 clips applied      A medium polyp was found in the ascending colon. The polyp was sessile.       The polyp was removed with a hot snare. Resection and retrieval were       complete. 2 clips applied      Multiple medium-mouthed  diverticula were found in the sigmoid colon, in       the descending colon, in the transverse colon and in the ascending colon.      Internal hemorrhoids were found during endoscopy. The hemorrhoids were       small, medium-sized and Grade I (internal hemorrhoids that do not       prolapse).      The exam was otherwise without abnormality. Impression:        - One diminutive polyp in the cecum. Resected and                     retrieved.                    - One small polyp in the ascending colon. Resected and                     retrieved.                    - One medium polyp in the ascending colon. Resected and                     retrieved.                    - One medium polyp in the ascending colon. Resected and                     retrieved.                    - Diverticulosis in the sigmoid colon, in the descending                     colon, in the transverse colon and in the ascending colon.                    - Internal hemorrhoids.                    - The examination was otherwise normal. Recommendation:    - Await pathology results. Manya Silvas, MD 06/11/2015 3:44:04 PM This report has been signed electronically. Number of Addenda: 0 Note Initiated On: 06/11/2015 2:57 PM Scope Withdrawal Time: 0 hours 19 minutes 2 seconds  Total Procedure Duration: 0 hours 31 minutes 11 seconds       [facility]

## 2015-06-11 NOTE — Anesthesia Procedure Notes (Signed)
Date/Time: 06/11/2015 2:59 PM Performed by: Doreen Salvage Pre-anesthesia Checklist: Patient identified, Emergency Drugs available, Suction available and Patient being monitored Patient Re-evaluated:Patient Re-evaluated prior to inductionOxygen Delivery Method: Nasal cannula Intubation Type: IV induction Dental Injury: Teeth and Oropharynx as per pre-operative assessment  Comments: Nasal cannula with etCO2 monitoring

## 2015-06-11 NOTE — Anesthesia Preprocedure Evaluation (Signed)
Anesthesia Evaluation  Patient identified by MRN, date of birth, ID band Patient awake    Reviewed: Allergy & Precautions, H&P , NPO status , Patient's Chart, lab work & pertinent test results  Airway Mallampati: III  TM Distance: >3 FB Neck ROM: limited    Dental  (+) Poor Dentition, Missing, Chipped   Pulmonary Current Smoker,    Pulmonary exam normal breath sounds clear to auscultation       Cardiovascular Exercise Tolerance: Good (-) angina(-) Past MI and (-) DOE Normal cardiovascular exam+ Valvular Problems/Murmurs  Rhythm:regular Rate:Normal     Neuro/Psych PSYCHIATRIC DISORDERS Anxiety  Neuromuscular disease    GI/Hepatic Neg liver ROS, GERD  Controlled,  Endo/Other  negative endocrine ROS  Renal/GU negative Renal ROS  negative genitourinary   Musculoskeletal  (+) Arthritis ,   Abdominal   Peds  Hematology negative hematology ROS (+)   Anesthesia Other Findings Past Medical History:   Anxiety                                                      Fibromyalgia                                                 Arthritis                                                    Anemia                                                       Rheumatoid arthritis (HCC)                                   GERD (gastroesophageal reflux disease)                       Hyperlipidemia                                               Arthritis                                                      Comment:Dr. Devoshar   Osteopenia                                                   Psoriasis with pustules  Heart murmur                                                Past Surgical History:   ABDOMINAL HYSTERECTOMY                                        EYE SURGERY                                                     Comment:age 64   CARPAL TUNNEL RELEASE                           Bilateral             CARPAL TUNNEL RELEASE                           Right 01/15/2015      Comment:Procedure: CARPAL TUNNEL RELEASE, SYNOVECTOMY;               Surgeon: Christophe Louis, MD;  Location: [facility]              ORS;  Service: Orthopedics;  Laterality: Right;   ABDOMINAL HYSTERECTOMY                           1980         CARPAL TUNNEL RELEASE                            2016           Comment:both hands, revision 2016   EYE SURGERY                                                     Comment:both eyes   CARPAL TUNNEL RELEASE                           Left 03/19/2015      Comment:Procedure: CARPAL TUNNEL RELEASE;  Surgeon:               Christophe Louis, MD;  Location: [facility] ORS;                Service: Orthopedics;  Laterality: Left;   ULNAR NERVE TRANSPOSITION                       Left 03/19/2015      Comment:Procedure: ULNAR NERVE               DECOMPRESSION/TRANSPOSITION;  Surgeon:               Christophe Louis, MD;  Location: [facility] ORS;                Service: Orthopedics;  Laterality: Left;  Reproductive/Obstetrics negative OB ROS                             Anesthesia Physical Anesthesia Plan  ASA: III  Anesthesia Plan: General   Post-op Pain Management:    Induction:   Airway Management Planned:   Additional Equipment:   Intra-op Plan:   Post-operative Plan:   Informed Consent: I have reviewed the patients History and Physical, chart, labs and discussed the procedure including the risks, benefits and alternatives for the proposed anesthesia with the patient or authorized representative who has indicated his/her understanding and acceptance.   Dental Advisory Given  Plan Discussed with: Anesthesiologist, CRNA and Surgeon  Anesthesia Plan Comments:         Anesthesia Quick Evaluation

## 2015-06-11 NOTE — Anesthesia Postprocedure Evaluation (Signed)
  Anesthesia Post-op Note  Patient: Wanda Cervantes  Procedure(s) Performed: Procedure(s): COLONOSCOPY WITH PROPOFOL (N/A)  Anesthesia type:General  Patient location: PACU  Post pain: Pain level controlled  Post assessment: Post-op Vital signs reviewed, Patient's Cardiovascular Status Stable, Respiratory Function Stable, Patent Airway and No signs of Nausea or vomiting  Post vital signs: Reviewed and stable  Last Vitals:  Filed Vitals:   06/11/15 1617  BP: 140/70  Pulse: 75  Temp:   Resp: 18    Level of consciousness: awake, alert  and patient cooperative  Complications: No apparent anesthesia complications

## 2015-06-15 LAB — SURGICAL PATHOLOGY

## 2015-06-17 ENCOUNTER — Encounter: Payer: Self-pay | Admitting: Unknown Physician Specialty

## 2015-06-21 LAB — HM COLONOSCOPY: HM COLON: ABNORMAL — AB

## 2015-06-24 DIAGNOSIS — M79672 Pain in left foot: Secondary | ICD-10-CM | POA: Diagnosis not present

## 2015-06-24 DIAGNOSIS — M659 Synovitis and tenosynovitis, unspecified: Secondary | ICD-10-CM | POA: Diagnosis not present

## 2015-06-24 DIAGNOSIS — M79671 Pain in right foot: Secondary | ICD-10-CM | POA: Diagnosis not present

## 2015-06-24 DIAGNOSIS — M069 Rheumatoid arthritis, unspecified: Secondary | ICD-10-CM | POA: Diagnosis not present

## 2015-07-16 ENCOUNTER — Ambulatory Visit (INDEPENDENT_AMBULATORY_CARE_PROVIDER_SITE_OTHER): Payer: Medicare HMO | Admitting: Physician Assistant

## 2015-07-16 ENCOUNTER — Encounter: Payer: Self-pay | Admitting: Physician Assistant

## 2015-07-16 VITALS — BP 132/82 | HR 79 | Temp 97.9°F | Wt 159.0 lb

## 2015-07-16 DIAGNOSIS — M5432 Sciatica, left side: Secondary | ICD-10-CM | POA: Diagnosis not present

## 2015-07-16 DIAGNOSIS — M5430 Sciatica, unspecified side: Secondary | ICD-10-CM | POA: Insufficient documentation

## 2015-07-16 MED ORDER — METHYLPREDNISOLONE 4 MG PO TBPK: ORAL_TABLET | ORAL | Status: DC

## 2015-07-16 MED ORDER — OXYCODONE-ACETAMINOPHEN 10-325 MG PO TABS: 1.0000 | ORAL_TABLET | ORAL | Status: DC | PRN

## 2015-07-16 NOTE — Assessment & Plan Note (Signed)
Rx Medtrol pack. Pain medication refilled -- 30 tablets only. Take as directed. Supportive measures discussed with patient.

## 2015-07-16 NOTE — Patient Instructions (Signed)
Please take the steroid pack as directed. Use the Percocet only if needed for breakthrough pain. Avoid heavy lifting. Avoid prolonged sitting on the affected side.  Follow-up with Dr. Gilford Rile if symptoms are not resolving.

## 2015-07-16 NOTE — Progress Notes (Signed)
Patient presents to clinic today c/o 3-4 days of left hip pain described as sharp and burning along with chronic dull ache of her OA. Pain, per patient, is radiating into LLE. Pain is worse with ambulation. Denies numbness or tingling of lower extremity.   Past Medical History  Diagnosis Date  . Anxiety   . Fibromyalgia   . Arthritis   . Anemia   . Rheumatoid arthritis ([place])   . GERD (gastroesophageal reflux disease)   . Hyperlipidemia   . Arthritis     Dr. Ruthell Rummage  . Osteopenia   . Psoriasis with pustules   . Heart murmur     Current Outpatient Prescriptions on File Prior to Visit  Medication Sig Dispense Refill  . naproxen sodium (ANAPROX) 220 MG tablet Take 440 mg by mouth 2 (two) times daily as needed (for pain).    . cyanocobalamin (,VITAMIN B-12,) 1000 MCG/ML injection Inject 1,000 mcg into the muscle every 30 (thirty) days.      No current facility-administered medications on file prior to visit.    Allergies  Allergen Reactions  . Aspirin Other (See Comments)    Reaction:  Burns pts stomach   . Cimzia [Certolizumab Pegol] Hives  . Norco [Hydrocodone-Acetaminophen] Other (See Comments)    Reaction:  Chills, sore throat, and weakness.    . Statins Other (See Comments)    Reaction:  Joint and muscle pain   . Zantac [Ranitidine Hcl] Nausea And Vomiting    Family History  Problem Relation Age of Onset  . Arthritis Mother   . Cancer Mother     breast  . Hyperlipidemia Mother   . Heart disease Mother   . Hypertension Mother   . Sudden death Mother   . Mental retardation Mother   . Arthritis Father   . Hyperlipidemia Father   . Heart disease Father   . Hypertension Father   . Mental retardation Father     Social History   Social History  . Marital Status: Married    Spouse Name: N/A  . Number of Children: N/A  . Years of Education: N/A   Social History Main Topics  . Smoking status: Current Every Day Smoker -- 0.25 packs/day for 40 years   Types: Cigarettes  . Smokeless tobacco: None  . Alcohol Use: Yes     Comment: 3-4 glasses wine/day  . Drug Use: No  . Sexual Activity: Not Asked   Other Topics Concern  . None   Social History Narrative   ** Merged History Encounter **       Lives with husband in [place] - See HPI.  All other ROS are negative.  BP 132/82 mmHg  Pulse 79  Temp(Src) 97.9 F (36.6 C) (Oral)  Wt 159 lb (72.122 kg)  SpO2 97%  Physical Exam  Constitutional: She is oriented to person, place, and time and well-developed, well-nourished, and in no distress.  HENT:  Head: Normocephalic and atraumatic.  Cardiovascular: Normal rate, regular rhythm, normal heart sounds and intact distal pulses.   Pulmonary/Chest: Effort normal and breath sounds normal. No respiratory distress. She has no wheezes. She has no rales. She exhibits no tenderness.  Musculoskeletal:       Left hip: She exhibits normal range of motion, normal strength and no tenderness.  Neurological: She is alert and oriented to person, place, and time.  Skin: Skin is warm and dry. No rash noted.  Psychiatric: Affect normal.  Vitals  reviewed.   Recent Results (from the past 2160 hour(s))  Surgical pathology     Status: None   Collection Time: 06/11/15  3:20 PM  Result Value Ref Range   SURGICAL PATHOLOGY      Surgical Pathology CASE: [id number] PATIENT: Elane Crite Surgical Pathology Report     SPECIMEN SUBMITTED: A. Colon polyp, cecum, cold bx B. Colon polyp, ascending, hot snare C. Colon polyp, distal ascending, hot snare D. Colon polyp, distal ascending, hot snare  CLINICAL HISTORY: None provided  PRE-OPERATIVE DIAGNOSIS: Screening  POST-OPERATIVE DIAGNOSIS: Colon polyps     DIAGNOSIS: A. COLON POLYP, CECUM; COLD BIOPSY: - COLONIC MUCOSA WITHIN NORMAL LIMITS.  B. COLON POLYP, ASCENDING; HOT SNARE: - TUBULAR ADENOMA. - NEGATIVE FOR HIGH-GRADE DYSPLASIA AND MALIGNANCY.  C. COLON  POLYP, DISTAL ASCENDING; HOT SNARE: - SESSILE SERRATED ADENOMA, SEE COMMENT. - NEGATIVE FOR CYTOLOGIC DYSPLASIA.  D. COLON POLYP, DISTAL ASCENDING; HOT SNARE: - SESSILE SERRATED ADENOMA, SEE COMMENT. - NEGATIVE FOR CYTOLOGIC DYSPLASIA.  Comment: Sessile serrated adenomas (SSAs) by morphologic definition demonstrate architectural (low grade) dysplasia, with or without  concurrent cytologic dysplasia.  SSAs are thought to be precursor lesions to a subset of colonic adenocarcinomas that arise through the serrated neoplastic pathway rather than the classical neoplasia pathway. Lesions of the serrated pathway have a high frequency of BRAF mutation and are more likely to be microsatellite unstable compared to tubular / villous adenomas of the classical pathway.  The recommended surveillance interval for a SSA <10 mm with no cytologic dysplasia is 5 years.  A SSA ?10 mm and a sessile serrated polyp with cytological dysplasia should be managed like a high risk adenoma (recommended surveillance interval of 3 years).  Reference: Guidelines for Colonoscopy Surveillance After Screening and Polypectomy: A Consensus Update by the [place] Multi-Society Task Force on Colorectal Cancer. [place] [employer], 2012.    GROSS DESCRIPTION: A. Labeled: Cold biopsy of cecal polyp  Tissue fragment(s): 1  Size: 0.4 cm  Description:   Tan tissue fragment  Entirely submitted in 1 cassette(s).  B. Labeled: Hot snare of ascending colon polyp  Tissue fragment(s): 2  Size: 0.3-0.5 cm  Description: Tan tissue fragments  Entirely submitted in 1 cassette(s).  C. Labeled: Hot snare distal ascending colon polyp  Tissue fragment(s): 1  Size: 1.2 x 0.7 x 0.3 cm  Description: Pale-tan polypoid tissue, probable based inked, sectioned  Entirely submitted in one cassette(s).  D. Labeled: Hot snare of distal descending colon polyp  Tissue fragment(s): 1  Size: 1.2 x 0.8 x 0.4  cm  Description: Pink red polypoid tissue, probable base inked, sectioned  Entirely submitted in one cassette(s).            Final Diagnosis performed by Delorse Lek, MD.  Electronically signed 06/15/2015 1:55:36PM    The electronic signature indicates that the named Attending Pathologist has evaluated the specimen  Technical component performed at [facility], [address] [place], [place] [address] Lab: [phone] Dir: Darrick Penna. Evette Doffing, MD  Professional component performed at [facility], [facility], [place], [place], [place] [address] Lab: [phone] Dir: Dellia Nims. Rubinas, MD      Assessment/Plan: Sciatica neuralgia Rx Medtrol pack. Pain medication refilled -- 30 tablets only. Take as directed. Supportive measures discussed with patient.

## 2015-07-16 NOTE — Progress Notes (Signed)
Pre visit review using our clinic review tool, if applicable. No additional management support is needed unless otherwise documented below in the visit note. 

## 2015-07-26 ENCOUNTER — Ambulatory Visit
Admission: RE | Admit: 2015-07-26 | Discharge: 2015-07-26 | Disposition: A | Discharge status: Home / Self Care | Payer: Medicare HMO | Source: Ambulatory Visit | Attending: Internal Medicine | Admitting: Internal Medicine

## 2015-07-26 DIAGNOSIS — Z1239 Encounter for other screening for malignant neoplasm of breast: Secondary | ICD-10-CM

## 2015-07-26 DIAGNOSIS — Z1231 Encounter for screening mammogram for malignant neoplasm of breast: Secondary | ICD-10-CM | POA: Diagnosis not present

## 2015-07-26 LAB — HM MAMMOGRAPHY

## 2015-08-12 DIAGNOSIS — M160 Bilateral primary osteoarthritis of hip: Secondary | ICD-10-CM | POA: Diagnosis not present

## 2015-08-23 DIAGNOSIS — Z0100 Encounter for examination of eyes and vision without abnormal findings: Secondary | ICD-10-CM | POA: Diagnosis not present

## 2015-10-15 DIAGNOSIS — M1612 Unilateral primary osteoarthritis, left hip: Secondary | ICD-10-CM | POA: Diagnosis not present

## 2015-10-26 ENCOUNTER — Ambulatory Visit (INDEPENDENT_AMBULATORY_CARE_PROVIDER_SITE_OTHER): Payer: Medicare HMO

## 2015-10-26 DIAGNOSIS — E538 Deficiency of other specified B group vitamins: Secondary | ICD-10-CM

## 2015-10-26 MED ORDER — CYANOCOBALAMIN 1000 MCG/ML IJ SOLN
1000.0000 ug | Freq: Once | INTRAMUSCULAR | Status: AC
  Administered 2015-10-26: 1000 ug via INTRAMUSCULAR

## 2015-10-26 NOTE — Progress Notes (Signed)
Patient came in for b12 injection.  Patient received in right deltoid.  Patient tolerated well.

## 2015-11-09 DIAGNOSIS — Z0100 Encounter for examination of eyes and vision without abnormal findings: Secondary | ICD-10-CM | POA: Diagnosis not present

## 2015-11-15 ENCOUNTER — Ambulatory Visit: Payer: Self-pay | Admitting: Internal Medicine

## 2015-11-19 ENCOUNTER — Ambulatory Visit (INDEPENDENT_AMBULATORY_CARE_PROVIDER_SITE_OTHER): Payer: Medicare HMO | Admitting: Internal Medicine

## 2015-11-19 ENCOUNTER — Encounter: Payer: Self-pay | Admitting: Internal Medicine

## 2015-11-19 VITALS — BP 153/81 | HR 101 | Temp 98.0°F | Ht 62.0 in | Wt 162.4 lb

## 2015-11-19 DIAGNOSIS — M069 Rheumatoid arthritis, unspecified: Secondary | ICD-10-CM | POA: Diagnosis not present

## 2015-11-19 DIAGNOSIS — Z86010 Personal history of colonic polyps: Secondary | ICD-10-CM | POA: Diagnosis not present

## 2015-11-19 DIAGNOSIS — R030 Elevated blood-pressure reading, without diagnosis of hypertension: Secondary | ICD-10-CM | POA: Diagnosis not present

## 2015-11-19 DIAGNOSIS — IMO0001 Reserved for inherently not codable concepts without codable children: Secondary | ICD-10-CM

## 2015-11-19 LAB — VITAMIN D 25 HYDROXY (VIT D DEFICIENCY, FRACTURES): VITD: 18.64 ng/mL — AB (ref 30.00–100.00)

## 2015-11-19 LAB — CBC WITH DIFFERENTIAL/PLATELET
BASOS ABS: 0 10*3/uL (ref 0.0–0.1)
Basophils Relative: 0.4 % (ref 0.0–3.0)
EOS PCT: 1.8 % (ref 0.0–5.0)
Eosinophils Absolute: 0.2 10*3/uL (ref 0.0–0.7)
HCT: 42 % (ref 36.0–46.0)
Hemoglobin: 14.4 g/dL (ref 12.0–15.0)
LYMPHS ABS: 2.3 10*3/uL (ref 0.7–4.0)
Lymphocytes Relative: 23.9 % (ref 12.0–46.0)
MCHC: 34.3 g/dL (ref 30.0–36.0)
MCV: 93.8 fl (ref 78.0–100.0)
MONOS PCT: 8.5 % (ref 3.0–12.0)
Monocytes Absolute: 0.8 10*3/uL (ref 0.1–1.0)
NEUTROS ABS: 6.4 10*3/uL (ref 1.4–7.7)
NEUTROS PCT: 65.4 % (ref 43.0–77.0)
PLATELETS: 234 10*3/uL (ref 150.0–400.0)
RBC: 4.48 Mil/uL (ref 3.87–5.11)
RDW: 15.1 % (ref 11.5–15.5)
WBC: 9.8 10*3/uL (ref 4.0–10.5)

## 2015-11-19 LAB — COMPREHENSIVE METABOLIC PANEL
ALBUMIN: 4.6 g/dL (ref 3.5–5.2)
ALT: 24 U/L (ref 0–35)
AST: 19 U/L (ref 0–37)
Alkaline Phosphatase: 101 U/L (ref 39–117)
BUN: 20 mg/dL (ref 6–23)
CHLORIDE: 101 meq/L (ref 96–112)
CO2: 27 meq/L (ref 19–32)
CREATININE: 0.66 mg/dL (ref 0.40–1.20)
Calcium: 9.7 mg/dL (ref 8.4–10.5)
GFR: 96.36 mL/min (ref >60.00)
GLUCOSE: 96 mg/dL (ref 70–99)
POTASSIUM: 4.4 meq/L (ref 3.5–5.1)
Sodium: 139 mEq/L (ref 135–145)
Total Bilirubin: 0.7 mg/dL (ref 0.2–1.2)
Total Protein: 7.1 g/dL (ref 6.0–8.3)

## 2015-11-19 LAB — C-REACTIVE PROTEIN: CRP: 0.9 mg/dL (ref 0.5–20.0)

## 2015-11-19 LAB — TSH: TSH: 1.55 u[IU]/mL (ref 0.35–4.50)

## 2015-11-19 LAB — SEDIMENTATION RATE: Sed Rate: 16 mm/hr (ref 0–22)

## 2015-11-19 LAB — MAGNESIUM: Magnesium: 2.2 mg/dL (ref 1.5–2.5)

## 2015-11-19 NOTE — Patient Instructions (Signed)
Labs today   Follow up for physical

## 2015-11-19 NOTE — Progress Notes (Signed)
Pre visit review using our clinic review tool, if applicable. No additional management support is needed unless otherwise documented below in the visit note. 

## 2015-11-19 NOTE — Progress Notes (Signed)
Subjective:    Patient ID: Wanda Cervantes, female    DOB: Aug 20, 1951, 65 y.o.   MRN: [id number]  HPI  65YO female presents for follow up.  Recently had injection left hip with Dr. Sharlet Salina at [place]. Some improvement with this.  Continues to have muscle cramps. Questions if B12 might be low, however taking B12 shots. Aside from this feeling well. Feels kind of jittery today after having [employer] coffee.   Wt Readings from Last 3 Encounters:  11/19/15 162 lb 6 oz (73.653 kg)  07/16/15 159 lb (72.122 kg)  03/30/15 159 lb 12.8 oz (72.485 kg)   BP Readings from Last 3 Encounters:  11/19/15 153/81  07/16/15 132/82  06/11/15 140/70    Past Medical History  Diagnosis Date  . Anxiety   . Fibromyalgia   . Arthritis   . Anemia   . Rheumatoid arthritis ([place])   . GERD (gastroesophageal reflux disease)   . Hyperlipidemia   . Arthritis     Dr. Ruthell Rummage  . Osteopenia   . Psoriasis with pustules   . Heart murmur    Family History  Problem Relation Age of Onset  . Arthritis Mother   . Hyperlipidemia Mother   . Heart disease Mother   . Hypertension Mother   . Sudden death Mother   . Mental retardation Mother   . Breast cancer Mother 18  . Arthritis Father   . Hyperlipidemia Father   . Heart disease Father   . Hypertension Father   . Mental retardation Father   . Breast cancer Sister     1/2 sister-maternal side   Past Surgical History  Procedure Laterality Date  . Abdominal hysterectomy    . Eye surgery      age 33  . Carpal tunnel release Bilateral   . Carpal tunnel release Right 01/15/2015    Procedure: CARPAL TUNNEL RELEASE, SYNOVECTOMY;  Surgeon: Christophe Louis, MD;  Location: [facility] ORS;  Service: Orthopedics;  Laterality: Right;  . Abdominal hysterectomy  1980  . Carpal tunnel release  2016    both hands, revision 2016  . Eye surgery      both eyes  . Carpal tunnel release Left 03/19/2015    Procedure: CARPAL TUNNEL RELEASE;  Surgeon: Christophe Louis,  MD;  Location: [facility] ORS;  Service: Orthopedics;  Laterality: Left;  . Ulnar nerve transposition Left 03/19/2015    Procedure: ULNAR NERVE DECOMPRESSION/TRANSPOSITION;  Surgeon: Christophe Louis, MD;  Location: [facility] ORS;  Service: Orthopedics;  Laterality: Left;  . Colonoscopy with propofol N/A 06/11/2015    Procedure: COLONOSCOPY WITH PROPOFOL;  Surgeon: Manya Silvas, MD;  Location: [facility] ENDOSCOPY;  Service: Endoscopy;  Laterality: N/A;   Social History   Social History  . Marital Status: Married    Spouse Name: N/A  . Number of Children: N/A  . Years of Education: N/A   Social History Main Topics  . Smoking status: Current Every Day Smoker -- 0.25 packs/day for 40 years    Types: Cigarettes  . Smokeless tobacco: None  . Alcohol Use: Yes     Comment: 3-4 glasses wine/day  . Drug Use: No  . Sexual Activity: Not Asked   Other Topics Concern  . None   Social History Narrative   ** Merged History Encounter **       Lives with husband in [place]  Constitutional: Negative for fever, chills, appetite change, fatigue and unexpected weight change.  Eyes: Negative for visual  disturbance.  Respiratory: Negative for shortness of breath.   Cardiovascular: Negative for chest pain, palpitations and leg swelling.  Gastrointestinal: Negative for nausea, vomiting, abdominal pain, diarrhea and constipation.  Musculoskeletal: Positive for myalgias and arthralgias.  Skin: Negative for color change and rash.  Hematological: Negative for adenopathy. Does not bruise/bleed easily.  Psychiatric/Behavioral: Negative for suicidal ideas, sleep disturbance and dysphoric mood. The patient is not nervous/anxious.        Objective:    BP 153/81 mmHg  Pulse 101  Temp(Src) 98 F (36.7 C) (Oral)  Ht '5\' 2"'  (1.575 m)  Wt 162 lb 6 oz (73.653 kg)  BMI 29.69 kg/m2  SpO2 97% Physical Exam  Constitutional: She is oriented to person, place, and time. She appears well-developed  and well-nourished. No distress.  HENT:  Head: Normocephalic and atraumatic.  Right Ear: External ear normal.  Left Ear: External ear normal.  Nose: Nose normal.  Mouth/Throat: Oropharynx is clear and moist. No oropharyngeal exudate.  Eyes: Conjunctivae are normal. Pupils are equal, round, and reactive to light. Right eye exhibits no discharge. Left eye exhibits no discharge. No scleral icterus.  Neck: Normal range of motion. Neck supple. No tracheal deviation present. No thyromegaly present.  Cardiovascular: Regular rhythm, normal heart sounds and intact distal pulses.  Tachycardia present.  Exam reveals no gallop and no friction rub.   No murmur heard. Pulmonary/Chest: Effort normal and breath sounds normal. No respiratory distress. She has no wheezes. She has no rales. She exhibits no tenderness.  Musculoskeletal: Normal range of motion. She exhibits no edema or tenderness.  Lymphadenopathy:    She has no cervical adenopathy.  Neurological: She is alert and oriented to person, place, and time. No cranial nerve deficit. She exhibits normal muscle tone. Coordination normal.  Skin: Skin is warm and dry. No rash noted. She is not diaphoretic. No erythema. No pallor.  Psychiatric: She has a normal mood and affect. Her behavior is normal. Judgment and thought content normal.          Assessment & Plan:   Problem List Items Addressed This Visit      Unprioritized   Elevated BP    BP Readings from Last 3 Encounters:  11/19/15 153/81  07/16/15 132/82  06/11/15 140/70   BP slightly elevated today. Likely secondary to increased caffeine intake and recent steroid injection. Will monitor for now, and repeat check in 2-4 weeks.      History of colonic polyps    S/p recent colonoscopy with Dr. Tiffany Kocher 05/2015. Reviewed pathology from polyps, benign. Plan is for repeat colonoscopy in 3 years, 2019.      Rheumatoid arthritis (HCC) - Primary    RA, followed by Dr. Dossie Der. Will check ESR and  CRP today given some recent increase in myalgia symptoms. Continue Naproxen.      Relevant Orders   TSH   CBC with Differential/Platelet   Comprehensive metabolic panel   VITAMIN D 25 Hydroxy (Vit-D Deficiency, Fractures)   Magnesium   Sed Rate (ESR)   C-reactive protein       Return in about 4 weeks (around 12/17/2015) for Physical.  Ronette Deter, MD Internal Medicine [facility]

## 2015-11-19 NOTE — Assessment & Plan Note (Signed)
S/p recent colonoscopy with Dr. Tiffany Kocher 05/2015. Reviewed pathology from polyps, benign. Plan is for repeat colonoscopy in 3 years, 2019.

## 2015-11-19 NOTE — Assessment & Plan Note (Signed)
RA, followed by Dr. Syed. Will check ESR and CRP today given some recent increase in myalgia symptoms. Continue Naproxen. 

## 2015-11-19 NOTE — Assessment & Plan Note (Signed)
BP Readings from Last 3 Encounters:  11/19/15 153/81  07/16/15 132/82  06/11/15 140/70   BP slightly elevated today. Likely secondary to increased caffeine intake and recent steroid injection. Will monitor for now, and repeat check in 2-4 weeks.

## 2015-11-23 ENCOUNTER — Ambulatory Visit (INDEPENDENT_AMBULATORY_CARE_PROVIDER_SITE_OTHER): Payer: Medicare HMO

## 2015-11-23 ENCOUNTER — Telehealth: Payer: Self-pay | Admitting: Internal Medicine

## 2015-11-23 ENCOUNTER — Telehealth: Payer: Self-pay | Admitting: *Deleted

## 2015-11-23 DIAGNOSIS — E538 Deficiency of other specified B group vitamins: Secondary | ICD-10-CM | POA: Diagnosis not present

## 2015-11-23 MED ORDER — CYANOCOBALAMIN 1000 MCG/ML IJ SOLN
1000.0000 ug | Freq: Once | INTRAMUSCULAR | Status: AC
  Administered 2015-11-23: 1000 ug via INTRAMUSCULAR

## 2015-11-23 NOTE — Telephone Encounter (Signed)
Attempted to call patient back to give results, unable to leave a VM on her phone number provided as the mailbox was full.

## 2015-11-23 NOTE — Telephone Encounter (Signed)
Patient has requested labs results from 11/19/15 A detail message can be left on answer service.  [phone]

## 2015-11-23 NOTE — Telephone Encounter (Signed)
Patient left voice mail wanting lab results called to her.

## 2015-11-23 NOTE — Telephone Encounter (Signed)
Spoke with patient and gave her the lab results. Thanks

## 2015-11-23 NOTE — Telephone Encounter (Signed)
Duplicate telephone note.  

## 2015-11-23 NOTE — Telephone Encounter (Signed)
Attempted to call patient a second time.  Not able to leave a VM.

## 2015-11-23 NOTE — Progress Notes (Signed)
Patient came into office for B12 injection in left deltoid. Patient tolerated injectiion well.

## 2015-12-08 DIAGNOSIS — M1611 Unilateral primary osteoarthritis, right hip: Secondary | ICD-10-CM | POA: Diagnosis not present

## 2015-12-14 ENCOUNTER — Ambulatory Visit: Payer: Self-pay

## 2016-01-11 ENCOUNTER — Ambulatory Visit (INDEPENDENT_AMBULATORY_CARE_PROVIDER_SITE_OTHER): Payer: Medicare HMO

## 2016-01-11 DIAGNOSIS — E538 Deficiency of other specified B group vitamins: Secondary | ICD-10-CM | POA: Diagnosis not present

## 2016-01-11 MED ORDER — CYANOCOBALAMIN 1000 MCG/ML IJ SOLN
1000.0000 ug | Freq: Once | INTRAMUSCULAR | Status: AC
  Administered 2016-01-11: 1000 ug via INTRAMUSCULAR

## 2016-01-11 NOTE — Progress Notes (Signed)
Patient came in for B12 injection.  Patient received in the Right deltoid.  Patient tolerated well.

## 2016-01-26 DIAGNOSIS — M1612 Unilateral primary osteoarthritis, left hip: Secondary | ICD-10-CM | POA: Diagnosis not present

## 2016-02-10 DIAGNOSIS — M160 Bilateral primary osteoarthritis of hip: Secondary | ICD-10-CM | POA: Diagnosis not present

## 2016-02-23 ENCOUNTER — Encounter: Payer: Self-pay | Admitting: Internal Medicine

## 2016-02-23 ENCOUNTER — Ambulatory Visit (INDEPENDENT_AMBULATORY_CARE_PROVIDER_SITE_OTHER): Payer: Medicare HMO | Admitting: Internal Medicine

## 2016-02-23 ENCOUNTER — Other Ambulatory Visit (HOSPITAL_COMMUNITY)
Admission: RE | Admit: 2016-02-23 | Discharge: 2016-02-23 | Disposition: A | Discharge status: Home / Self Care | Payer: Medicare HMO | Source: Ambulatory Visit | Attending: Internal Medicine | Admitting: Internal Medicine

## 2016-02-23 VITALS — BP 158/94 | HR 100 | Ht 62.0 in | Wt 153.6 lb

## 2016-02-23 DIAGNOSIS — Z0000 Encounter for general adult medical examination without abnormal findings: Secondary | ICD-10-CM | POA: Diagnosis not present

## 2016-02-23 DIAGNOSIS — Z1151 Encounter for screening for human papillomavirus (HPV): Secondary | ICD-10-CM | POA: Diagnosis present

## 2016-02-23 DIAGNOSIS — Z01419 Encounter for gynecological examination (general) (routine) without abnormal findings: Secondary | ICD-10-CM | POA: Diagnosis present

## 2016-02-23 DIAGNOSIS — Z7689 Persons encountering health services in other specified circumstances: Secondary | ICD-10-CM | POA: Insufficient documentation

## 2016-02-23 NOTE — Assessment & Plan Note (Signed)
General medical exam normal today including breast and pelvic exam. PAP pending. Mammogram UTD. Colonoscopy UTD. Labs ordered. Encouraged smoking cessation.  Encouraged healthy diet and exercise.

## 2016-02-23 NOTE — Progress Notes (Signed)
Pre visit review using our clinic review tool, if applicable. No additional management support is needed unless otherwise documented below in the visit note. 

## 2016-02-23 NOTE — Progress Notes (Signed)
Subjective:    Patient ID: Wanda Cervantes, female    DOB: Feb 02, 1951, 65 y.o.   MRN: [id number]  HPI  65YO female presents for physical exam.  Generally feeling well. Taking only Aleve for pain. Has Percocet for extreme flares of pain, as prescribed by physician at Urgent Care.    Wt Readings from Last 3 Encounters:  02/23/16 153 lb 9.6 oz (69.673 kg)  11/19/15 162 lb 6 oz (73.653 kg)  07/16/15 159 lb (72.122 kg)   BP Readings from Last 3 Encounters:  02/23/16 158/94  11/19/15 153/81  07/16/15 132/82    Past Medical History  Diagnosis Date  . Anxiety   . Fibromyalgia   . Arthritis   . Anemia   . Rheumatoid arthritis ([place])   . GERD (gastroesophageal reflux disease)   . Hyperlipidemia   . Arthritis     Dr. Ruthell Rummage  . Osteopenia   . Psoriasis with pustules   . Heart murmur    Family History  Problem Relation Age of Onset  . Arthritis Mother   . Hyperlipidemia Mother   . Heart disease Mother   . Hypertension Mother   . Sudden death Mother   . Mental retardation Mother   . Breast cancer Mother 86  . Arthritis Father   . Hyperlipidemia Father   . Heart disease Father   . Hypertension Father   . Mental retardation Father   . Breast cancer Sister     1/2 sister-maternal side   Past Surgical History  Procedure Laterality Date  . Abdominal hysterectomy    . Eye surgery      age 68  . Carpal tunnel release Bilateral   . Carpal tunnel release Right 01/15/2015    Procedure: CARPAL TUNNEL RELEASE, SYNOVECTOMY;  Surgeon: Christophe Louis, MD;  Location: [facility] ORS;  Service: Orthopedics;  Laterality: Right;  . Abdominal hysterectomy  1980  . Carpal tunnel release  2016    both hands, revision 2016  . Eye surgery      both eyes  . Carpal tunnel release Left 03/19/2015    Procedure: CARPAL TUNNEL RELEASE;  Surgeon: Christophe Louis, MD;  Location: [facility] ORS;  Service: Orthopedics;  Laterality: Left;  . Ulnar nerve transposition Left 03/19/2015    Procedure: ULNAR  NERVE DECOMPRESSION/TRANSPOSITION;  Surgeon: Christophe Louis, MD;  Location: [facility] ORS;  Service: Orthopedics;  Laterality: Left;  . Colonoscopy with propofol N/A 06/11/2015    Procedure: COLONOSCOPY WITH PROPOFOL;  Surgeon: Manya Silvas, MD;  Location: [facility] ENDOSCOPY;  Service: Endoscopy;  Laterality: N/A;   Social History   Social History  . Marital Status: Married    Spouse Name: N/A  . Number of Children: N/A  . Years of Education: N/A   Social History Main Topics  . Smoking status: Current Every Day Smoker -- 0.25 packs/day for 40 years    Types: Cigarettes  . Smokeless tobacco: Not on file  . Alcohol Use: Yes     Comment: 3-4 glasses wine/day  . Drug Use: No  . Sexual Activity: Not on file   Other Topics Concern  . Not on file   Social History Narrative   ** Merged History Encounter **       Lives with husband in [place]  Constitutional: Negative for fever, chills, appetite change, fatigue and unexpected weight change.  Eyes: Negative for visual disturbance.  Respiratory: Negative for shortness of breath.   Cardiovascular: Negative for chest pain and  leg swelling.  Gastrointestinal: Negative for nausea, vomiting, abdominal pain, diarrhea and constipation.  Genitourinary: Negative for pelvic pain.  Musculoskeletal: Positive for arthralgias. Negative for myalgias.  Skin: Negative for color change and rash.  Neurological: Negative for weakness.  Hematological: Negative for adenopathy. Does not bruise/bleed easily.  Psychiatric/Behavioral: Negative for suicidal ideas, sleep disturbance and dysphoric mood. The patient is not nervous/anxious.        Objective:    BP 158/94 mmHg  Pulse 100  Ht 5\' 2"  (1.575 m)  Wt 153 lb 9.6 oz (69.673 kg)  BMI 28.09 kg/m2  SpO2 98% Physical Exam  Constitutional: She is oriented to person, place, and time. She appears well-developed and well-nourished. No distress.  HENT:  Head: Normocephalic and  atraumatic.  Right Ear: External ear normal.  Left Ear: External ear normal.  Nose: Nose normal.  Mouth/Throat: Oropharynx is clear and moist. No oropharyngeal exudate.  Eyes: Conjunctivae are normal. Pupils are equal, round, and reactive to light. Right eye exhibits no discharge. Left eye exhibits no discharge. No scleral icterus.  Neck: Normal range of motion. Neck supple. No tracheal deviation present. No thyromegaly present.  Cardiovascular: Normal rate, regular rhythm, normal heart sounds and intact distal pulses.  Exam reveals no gallop and no friction rub.   No murmur heard. Pulmonary/Chest: Effort normal and breath sounds normal. No respiratory distress. She has no wheezes. She has no rales. She exhibits no tenderness.  Abdominal: Soft. Bowel sounds are normal. She exhibits no distension and no mass. There is no tenderness. There is no rebound and no guarding.  Genitourinary: Rectum normal, vagina normal and uterus normal. No breast swelling, tenderness, discharge or bleeding. Pelvic exam was performed with patient supine. There is no rash, tenderness or lesion on the right labia. There is no rash, tenderness or lesion on the left labia. Uterus is not enlarged and not tender. Cervix exhibits no motion tenderness, no discharge and no friability. Right adnexum displays no mass, no tenderness and no fullness. Left adnexum displays no mass, no tenderness and no fullness. No erythema or tenderness in the vagina. No vaginal discharge found.  Musculoskeletal: Normal range of motion. She exhibits no edema or tenderness.  Lymphadenopathy:    She has no cervical adenopathy.  Neurological: She is alert and oriented to person, place, and time. No cranial nerve deficit. She exhibits normal muscle tone. Coordination normal.  Skin: Skin is warm and dry. No rash noted. She is not diaphoretic. No erythema. No pallor.  Psychiatric: She has a normal mood and affect. Her behavior is normal. Judgment and thought  content normal.          Assessment & Plan:   Problem List Items Addressed This Visit      Unprioritized   Routine general medical examination at a health care facility - Primary    General medical exam normal today including breast and pelvic exam. PAP pending. Mammogram UTD. Colonoscopy UTD. Labs ordered. Encouraged smoking cessation.  Encouraged healthy diet and exercise.      Relevant Orders   Comprehensive metabolic panel   Lipid panel   Cytology - PAP       Return in about 3 months (around 05/25/2016) for New Patient.  Ronette Deter, MD Internal Medicine [facility]

## 2016-02-23 NOTE — Patient Instructions (Signed)
Health Maintenance, Female Adopting a healthy lifestyle and getting preventive care can go a long way to promote health and wellness. Talk with your health care provider about what schedule of regular examinations is right for you. This is a good chance for you to check in with your provider about disease prevention and staying healthy. In between checkups, there are plenty of things you can do on your own. Experts have done a lot of research about which lifestyle changes and preventive measures are most likely to keep you healthy. Ask your health care provider for more information. WEIGHT AND DIET  Eat a healthy diet  Be sure to include plenty of vegetables, fruits, low-fat dairy products, and lean protein.  Do not eat a lot of foods high in solid fats, added sugars, or salt.  Get regular exercise. This is one of the most important things you can do for your health.  Most adults should exercise for at least 150 minutes each week. The exercise should increase your heart rate and make you sweat (moderate-intensity exercise).  Most adults should also do strengthening exercises at least twice a week. This is in addition to the moderate-intensity exercise.  Maintain a healthy weight  Body mass index (BMI) is a measurement that can be used to identify possible weight problems. It estimates body fat based on height and weight. Your health care provider can help determine your BMI and help you achieve or maintain a healthy weight.  For females 20 years of age and older:   A BMI below 18.5 is considered underweight.  A BMI of 18.5 to 24.9 is normal.  A BMI of 25 to 29.9 is considered overweight.  A BMI of 30 and above is considered obese.  Watch levels of cholesterol and blood lipids  You should start having your blood tested for lipids and cholesterol at 63 years of age, then have this test every 5 years.  You may need to have your cholesterol levels checked more often if:  Your lipid  or cholesterol levels are high.  You are older than 63 years of age.  You are at high risk for heart disease.  CANCER SCREENING   Lung Cancer  Lung cancer screening is recommended for adults 55-80 years old who are at high risk for lung cancer because of a history of smoking.  A yearly low-dose CT scan of the lungs is recommended for people who:  Currently smoke.  Have quit within the past 15 years.  Have at least a 30-pack-year history of smoking. A pack year is smoking an average of one pack of cigarettes a day for 1 year.  Yearly screening should continue until it has been 15 years since you quit.  Yearly screening should stop if you develop a health problem that would prevent you from having lung cancer treatment.  Breast Cancer  Practice breast self-awareness. This means understanding how your breasts normally appear and feel.  It also means doing regular breast self-exams. Let your health care provider know about any changes, no matter how small.  If you are in your 20s or 30s, you should have a clinical breast exam (CBE) by a health care provider every 1-3 years as part of a regular health exam.  If you are 40 or older, have a CBE every year. Also consider having a breast X-ray (mammogram) every year.  If you have a family history of breast cancer, talk to your health care provider about genetic screening.  If you   are at high risk for breast cancer, talk to your health care provider about having an MRI and a mammogram every year.  Breast cancer gene (BRCA) assessment is recommended for women who have family members with BRCA-related cancers. BRCA-related cancers include:  Breast.  Ovarian.  Tubal.  Peritoneal cancers.  Results of the assessment will determine the need for genetic counseling and BRCA1 and BRCA2 testing. Cervical Cancer Your health care provider may recommend that you be screened regularly for cancer of the pelvic organs (ovaries, uterus, and  vagina). This screening involves a pelvic examination, including checking for microscopic changes to the surface of your cervix (Pap test). You may be encouraged to have this screening done every 3 years, beginning at age 21.  For women ages 30-65, health care providers may recommend pelvic exams and Pap testing every 3 years, or they may recommend the Pap and pelvic exam, combined with testing for human papilloma virus (HPV), every 5 years. Some types of HPV increase your risk of cervical cancer. Testing for HPV may also be done on women of any age with unclear Pap test results.  Other health care providers may not recommend any screening for nonpregnant women who are considered low risk for pelvic cancer and who do not have symptoms. Ask your health care provider if a screening pelvic exam is right for you.  If you have had past treatment for cervical cancer or a condition that could lead to cancer, you need Pap tests and screening for cancer for at least 20 years after your treatment. If Pap tests have been discontinued, your risk factors (such as having a new sexual partner) need to be reassessed to determine if screening should resume. Some women have medical problems that increase the chance of getting cervical cancer. In these cases, your health care provider may recommend more frequent screening and Pap tests. Colorectal Cancer  This type of cancer can be detected and often prevented.  Routine colorectal cancer screening usually begins at 63 years of age and continues through 63 years of age.  Your health care provider may recommend screening at an earlier age if you have risk factors for colon cancer.  Your health care provider may also recommend using home test kits to check for hidden blood in the stool.  A small camera at the end of a tube can be used to examine your colon directly (sigmoidoscopy or colonoscopy). This is done to check for the earliest forms of colorectal  cancer.  Routine screening usually begins at age 50.  Direct examination of the colon should be repeated every 5-10 years through 63 years of age. However, you may need to be screened more often if early forms of precancerous polyps or small growths are found. Skin Cancer  Check your skin from head to toe regularly.  Tell your health care provider about any new moles or changes in moles, especially if there is a change in a mole's shape or color.  Also tell your health care provider if you have a mole that is larger than the size of a pencil eraser.  Always use sunscreen. Apply sunscreen liberally and repeatedly throughout the day.  Protect yourself by wearing long sleeves, pants, a wide-brimmed hat, and sunglasses whenever you are outside. HEART DISEASE, DIABETES, AND HIGH BLOOD PRESSURE   High blood pressure causes heart disease and increases the risk of stroke. High blood pressure is more likely to develop in:  People who have blood pressure in the high end   of the normal range (130-139/85-89 mm Hg).  People who are overweight or obese.  People who are African American.  If you are 38-23 years of age, have your blood pressure checked every 3-5 years. If you are 61 years of age or older, have your blood pressure checked every year. You should have your blood pressure measured twice--once when you are at a hospital or clinic, and once when you are not at a hospital or clinic. Record the average of the two measurements. To check your blood pressure when you are not at a hospital or clinic, you can use:  An automated blood pressure machine at a pharmacy.  A home blood pressure monitor.  If you are between 45 years and 39 years old, ask your health care provider if you should take aspirin to prevent strokes.  Have regular diabetes screenings. This involves taking a blood sample to check your fasting blood sugar level.  If you are at a normal weight and have a low risk for diabetes,  have this test once every three years after 63 years of age.  If you are overweight and have a high risk for diabetes, consider being tested at a younger age or more often. PREVENTING INFECTION  Hepatitis B  If you have a higher risk for hepatitis B, you should be screened for this virus. You are considered at high risk for hepatitis B if:  You were born in a country where hepatitis B is common. Ask your health care provider which countries are considered high risk.  Your parents were born in a high-risk country, and you have not been immunized against hepatitis B (hepatitis B vaccine).  You have HIV or AIDS.  You use needles to inject street drugs.  You live with someone who has hepatitis B.  You have had sex with someone who has hepatitis B.  You get hemodialysis treatment.  You take certain medicines for conditions, including cancer, organ transplantation, and autoimmune conditions. Hepatitis C  Blood testing is recommended for:  Everyone born from [birth year] through 1963.  Anyone with known risk factors for hepatitis C. Sexually transmitted infections (STIs)  You should be screened for sexually transmitted infections (STIs) including gonorrhea and chlamydia if:  You are sexually active and are younger than 63 years of age.  You are older than 63 years of age and your health care provider tells you that you are at risk for this type of infection.  Your sexual activity has changed since you were last screened and you are at an increased risk for chlamydia or gonorrhea. Ask your health care provider if you are at risk.  If you do not have HIV, but are at risk, it may be recommended that you take a prescription medicine daily to prevent HIV infection. This is called pre-exposure prophylaxis (PrEP). You are considered at risk if:  You are sexually active and do not regularly use condoms or know the HIV status of your partner(s).  You take drugs by injection.  You are sexually  active with a partner who has HIV. Talk with your health care provider about whether you are at high risk of being infected with HIV. If you choose to begin PrEP, you should first be tested for HIV. You should then be tested every 3 months for as long as you are taking PrEP.  PREGNANCY   If you are premenopausal and you may become pregnant, ask your health care provider about preconception counseling.  If you may  become pregnant, take 400 to 800 micrograms (mcg) of folic acid every day.  If you want to prevent pregnancy, talk to your health care provider about birth control (contraception). OSTEOPOROSIS AND MENOPAUSE   Osteoporosis is a disease in which the bones lose minerals and strength with aging. This can result in serious bone fractures. Your risk for osteoporosis can be identified using a bone density scan.  If you are 61 years of age or older, or if you are at risk for osteoporosis and fractures, ask your health care provider if you should be screened.  Ask your health care provider whether you should take a calcium or vitamin D supplement to lower your risk for osteoporosis.  Menopause may have certain physical symptoms and risks.  Hormone replacement therapy may reduce some of these symptoms and risks. Talk to your health care provider about whether hormone replacement therapy is right for you.  HOME CARE INSTRUCTIONS   Schedule regular health, dental, and eye exams.  Stay current with your immunizations.   Do not use any tobacco products including cigarettes, chewing tobacco, or electronic cigarettes.  If you are pregnant, do not drink alcohol.  If you are breastfeeding, limit how much and how often you drink alcohol.  Limit alcohol intake to no more than 1 drink per day for nonpregnant women. One drink equals 12 ounces of beer, 5 ounces of wine, or 1 ounces of hard liquor.  Do not use street drugs.  Do not share needles.  Ask your health care provider for help if  you need support or information about quitting drugs.  Tell your health care provider if you often feel depressed.  Tell your health care provider if you have ever been abused or do not feel safe at home.   This information is not intended to replace advice given to you by your health care provider. Make sure you discuss any questions you have with your health care provider.   Document Released: 02/20/2011 Document Revised: 08/28/2014 Document Reviewed: 07/09/2013 Elsevier Interactive Patient Education [employer].

## 2016-02-24 LAB — CYTOLOGY - PAP

## 2016-02-25 ENCOUNTER — Telehealth: Payer: Self-pay | Admitting: *Deleted

## 2016-02-25 NOTE — Telephone Encounter (Signed)
Called patient. Gave PAP and HPV results. Patient verbalized understanding. Pt says will have future labs done soon.

## 2016-02-25 NOTE — Telephone Encounter (Signed)
Patient requested lab results  Pt contact [phone]

## 2016-03-27 ENCOUNTER — Telehealth: Payer: Self-pay | Admitting: Internal Medicine

## 2016-03-27 NOTE — Telephone Encounter (Signed)
Pt wants to know how you are test for Krone's Disease. Please call her.

## 2016-03-28 NOTE — Telephone Encounter (Signed)
Attempted to reach patient, left a VM to return my call to discuss.   Usually it is diagnosis ed with a colonoscopy and/or biopsy of the colon.

## 2016-03-30 NOTE — Telephone Encounter (Signed)
2 attempt to reach patient, she didn't answer left a second message to call back, will close note till she returns the call thanks

## 2016-04-07 ENCOUNTER — Other Ambulatory Visit: Payer: Self-pay

## 2016-04-28 ENCOUNTER — Other Ambulatory Visit: Payer: Self-pay

## 2016-04-28 DIAGNOSIS — M1612 Unilateral primary osteoarthritis, left hip: Secondary | ICD-10-CM | POA: Diagnosis not present

## 2016-05-02 ENCOUNTER — Other Ambulatory Visit: Payer: Self-pay

## 2016-05-04 DIAGNOSIS — M79671 Pain in right foot: Secondary | ICD-10-CM | POA: Diagnosis not present

## 2016-05-04 DIAGNOSIS — M79672 Pain in left foot: Secondary | ICD-10-CM | POA: Diagnosis not present

## 2016-05-10 ENCOUNTER — Telehealth: Payer: Self-pay | Admitting: Family

## 2016-05-10 NOTE — Telephone Encounter (Signed)
[facility] Patient Name: Wanda Cervantes DOB: 12/11/1950 Initial Comment Caller states she was giving her dog a bath on her knees and was blow drying her dog and she fell into the door a week ago. She has since been having a lot of pain in her breast and below her breast. She does not have a doctor yet, she has an appointment scheduled next Tuesday at 10 AM. Nurse Assessment Nurse: Wanda Ehrich, RN, Solmon Ice Date/Time ([place] Time): 05/10/2016 4:31:10 PM Confirm and document reason for call. If symptomatic, describe symptoms. You must click the next button to save text entered. ---A week and 1/2 ago she was on her knees washing her dog and she fell into the door with blow dryer under her chest - pain in chest for 1 1/2 weeks where it is hard to breath and when she breaths a certain way she hears a click - it seems to not be hurting as bad, but still hurts. Has the patient traveled out of the country within the last 30 days? ---No Does the patient have any new or worsening symptoms? ---Colbert Ewing a triage be completed? ---Yes Related visit to physician within the last 2 weeks? ---No Does the PT have any chronic conditions? (i.e. diabetes, asthma, etc.) ---No Is this a behavioral health or substance abuse call? ---No Guidelines Guideline Title Affirmed Question Affirmed Notes Chest Injury [1] High-risk adult (e.g., age > 36, osteoporosis, chronic steroid use) AND [2] still hurts Final Disposition User Go to ED Now Wanda Ehrich, RN, Solmon Ice Comments Was going to [facility] but will be going to a different one next time NP that is taking her place h/o heart MM is only heart issue, RA and fibromyalgia no trouble breathing - just hears a click when she breaths heavyin the bed Pain is more on R ribs pain 4 at highest today not on medication for RA - not on med for fibromyalgia but  has osteopenia spoke with Janett Billow to see who her new MD is Tues appt is still on books - but she said that when she spoke with caller she saids heComments was hurting with shallow breaths and bc the pt is fluctuation on breathing info - but says she can take deep breath - but a clicking noise will suggest ER today told caller to ER now (looking back on her initial statements) and she declines to go to the ER. Told her someone may call back but cant guarrantee when she voiced understanding and declined to go to ER gave her message from Edison at office who said she will keep the Tues appt. open for her unless she cancels. Told Janett Billow will recommend ER - Pt wants to wait until Tues appt with an NP called Margueritte that is not on the list and who is taking place of Dr. Gilford Rile who left. Tried to reach office to tell triage nurse about ER recommendation - pt refusing to go but office is closed Mable Paris, MD is one on schedule for Tues Referrals [place] REFUSED Disagree/Comply: Disagree Disagree/Comply Reason: Disagree with instructionsCall Id: [id number]

## 2016-05-11 NOTE — Telephone Encounter (Signed)
FYI. Please advise.

## 2016-05-11 NOTE — Telephone Encounter (Signed)
FYI

## 2016-05-11 NOTE — Telephone Encounter (Signed)
Please call patient and advise ED.  We cannot wait until next week if patient is having chest pain.

## 2016-05-11 NOTE — Telephone Encounter (Signed)
Patient stated she will not be going to the ED. She feels like her pain is not that bad and she stated that she will wait to be seen next week.  If SX she stated she will go to ED but as of right now she will not be going.

## 2016-05-16 ENCOUNTER — Ambulatory Visit (INDEPENDENT_AMBULATORY_CARE_PROVIDER_SITE_OTHER): Payer: Medicare HMO

## 2016-05-16 ENCOUNTER — Telehealth: Payer: Self-pay | Admitting: Family

## 2016-05-16 ENCOUNTER — Ambulatory Visit (INDEPENDENT_AMBULATORY_CARE_PROVIDER_SITE_OTHER): Payer: Medicare HMO | Admitting: Family

## 2016-05-16 ENCOUNTER — Encounter: Payer: Self-pay | Admitting: Family

## 2016-05-16 VITALS — BP 124/82 | HR 99 | Temp 98.3°F | Wt 155.5 lb

## 2016-05-16 DIAGNOSIS — R252 Cramp and spasm: Secondary | ICD-10-CM

## 2016-05-16 DIAGNOSIS — Z7189 Other specified counseling: Secondary | ICD-10-CM | POA: Diagnosis not present

## 2016-05-16 DIAGNOSIS — R0781 Pleurodynia: Secondary | ICD-10-CM

## 2016-05-16 DIAGNOSIS — R002 Palpitations: Secondary | ICD-10-CM | POA: Diagnosis not present

## 2016-05-16 DIAGNOSIS — M069 Rheumatoid arthritis, unspecified: Secondary | ICD-10-CM

## 2016-05-16 DIAGNOSIS — S2241XA Multiple fractures of ribs, right side, initial encounter for closed fracture: Secondary | ICD-10-CM | POA: Diagnosis not present

## 2016-05-16 DIAGNOSIS — Z7689 Persons encountering health services in other specified circumstances: Secondary | ICD-10-CM

## 2016-05-16 DIAGNOSIS — Z23 Encounter for immunization: Secondary | ICD-10-CM | POA: Diagnosis not present

## 2016-05-16 MED ORDER — CYANOCOBALAMIN 1000 MCG/ML IJ SOLN
1000.0000 ug | Freq: Once | INTRAMUSCULAR | Status: AC
  Administered 2016-05-16: 1000 ug via INTRAMUSCULAR

## 2016-05-16 NOTE — Progress Notes (Signed)
Subjective:    Patient ID: Wanda Cervantes, female    DOB: 07-30-1951, 65 y.o.   MRN: [id number]  CC: Wanda Cervantes is a 65 y.o. female who presents today for an acute visit.    HPI: Patient here for acute visit for multiple complaints. She would also like to establish care.  She c/o right rib pain after becoming 'tangled up while bathing dog' with hairdryer 3 weeks ago. Improved. Feel into wall. No bruising at the line. No SOB, chest tightness, chest pain, cough. Has tried aspercreme, relief without much relief.  Patient also describes occasional palpitations which is been going on for months to years. Though from caffeine. Had seen cardiology remotely in past. Occasionally lasting for less than a minute she feels her heart "fluttering". At that time she does not have crushing chest pain, shortness of breath.Denies exertional chest pain or pressure, numbness or tingling radiating to left arm or jaw,dizziness, frequent headaches, changes in vision.  RA- Stable. Doesn't want to take medications at this time.   Left hip pain- Chronic, Likley exacerbated by RA. Receiving joint injections from Dr. Sharlet Salina.   Would like B12 injections as has helped with cramping.    Would like to schedule mammogram today.     HISTORY:  Past Medical History:  Diagnosis Date  . Anemia   . Anxiety   . Arthritis   . Arthritis    Dr. Ruthell Rummage  . Fibromyalgia   . GERD (gastroesophageal reflux disease)   . Heart murmur   . Hyperlipidemia   . Osteopenia   . Psoriasis with pustules   . Rheumatoid arthritis [facility])    Past Surgical History:  Procedure Laterality Date  . ABDOMINAL HYSTERECTOMY    . ABDOMINAL HYSTERECTOMY  1980  . CARPAL TUNNEL RELEASE Bilateral   . CARPAL TUNNEL RELEASE Right 01/15/2015   Procedure: CARPAL TUNNEL RELEASE, SYNOVECTOMY;  Surgeon: Christophe Louis, MD;  Location: [facility] ORS;  Service: Orthopedics;  Laterality: Right;  . CARPAL TUNNEL RELEASE  2016   both hands, revision  2016  . CARPAL TUNNEL RELEASE Left 03/19/2015   Procedure: CARPAL TUNNEL RELEASE;  Surgeon: Christophe Louis, MD;  Location: [facility] ORS;  Service: Orthopedics;  Laterality: Left;  . COLONOSCOPY WITH PROPOFOL N/A 06/11/2015   Procedure: COLONOSCOPY WITH PROPOFOL;  Surgeon: Manya Silvas, MD;  Location: [facility] ENDOSCOPY;  Service: Endoscopy;  Laterality: N/A;  . EYE SURGERY     age 33  . EYE SURGERY     both eyes  . ULNAR NERVE TRANSPOSITION Left 03/19/2015   Procedure: ULNAR NERVE DECOMPRESSION/TRANSPOSITION;  Surgeon: Christophe Louis, MD;  Location: [facility] ORS;  Service: Orthopedics;  Laterality: Left;   Family History  Problem Relation Age of Onset  . Arthritis Mother   . Hyperlipidemia Mother   . Heart disease Mother   . Hypertension Mother   . Sudden death Mother   . Mental retardation Mother   . Breast cancer Mother 72  . Arthritis Father   . Hyperlipidemia Father   . Heart disease Father   . Hypertension Father   . Mental retardation Father   . Breast cancer Sister     1/2 sister-maternal side    Allergies: Aspirin; Cimzia [certolizumab pegol]; Norco [hydrocodone-acetaminophen]; Statins; and Zantac [ranitidine hcl] Current Outpatient Prescriptions on File Prior to Visit  Medication Sig Dispense Refill  . naproxen sodium (ANAPROX) 220 MG tablet Take 440 mg by mouth 2 (two) times daily as needed (for pain).     No  current facility-administered medications on file prior to visit.     Social History  Substance Use Topics  . Smoking status: Current Every Day Smoker    Packs/day: 0.25    Years: 40.00    Types: Cigarettes  . Smokeless tobacco: Never Used  . Alcohol use Yes     Comment: 3-4 glasses wine/day    Review of Systems  Constitutional: Negative for chills and fever.  Respiratory: Negative for cough, shortness of breath and wheezing.   Cardiovascular: Negative for chest pain and palpitations.  Gastrointestinal: Negative for nausea and vomiting.        Objective:    BP 124/82 (BP Location: Left Arm, Patient Position: Sitting, Cuff Size: Normal)   Pulse 99   Temp 98.3 F (36.8 C) (Oral)   Wt 155 lb 8 oz (70.5 kg)   SpO2 98%   BMI 28.44 kg/m    Physical Exam  Constitutional: She appears well-developed and well-nourished.  Eyes: Conjunctivae are normal.  Cardiovascular: Normal rate, regular rhythm, normal heart sounds and normal pulses.   Pulmonary/Chest: Effort normal and breath sounds normal. She has no wheezes. She has no rhonchi. She has no rales. Chest wall is not dull to percussion. She exhibits no mass, no tenderness, no bony tenderness, no laceration, no edema, no deformity and no swelling.    Area of pain as described by patient. No erythema, ecchymosis, tenderness appreciated during my exam.  No pain with deep inspiration.  Neurological: She is alert.  Skin: Skin is warm and dry.  Psychiatric: She has a normal mood and affect. Her speech is normal and behavior is normal. Thought content normal.  Vitals reviewed.      Assessment & Plan:   Problem List Items Addressed This Visit      Musculoskeletal and Integument   Rheumatoid arthritis ([place])    Stable. Follows with Dr Dossie Der. Per chart review, seen 08/2014  When she could not take anti TNFs due to insurance. Considering humira at that time.Continue to monitor.         Other   Encounter to establish care - Primary    Reviewed past medical, social, family history with patient. Patient did not have screening labs as part of physical this year. We have ordered those today. She is up-to-date on colonoscopy, Pap smear. She would like to schedule her mammogram herself, ordered.      Relevant Medications   cyanocobalamin ((VITAMIN B-12)) injection 1,000 mcg   Other Relevant Orders   CBC with Differential/Platelet   Comprehensive metabolic panel   Hemoglobin A1c   Lipid panel   TSH   VITAMIN D 25 Hydroxy (Vit-D Deficiency, Fractures)   MM DIGITAL SCREENING BILATERAL    Palpitations    Reassured by EKG SR. 100 bpm. when compared to EKG 03/2015, no significant changes. No acute signs of ischemia. Concern for arrhythmia as patient describes "flutter". No acute symptoms including shortness of breath, chest pain. Referral placed to cardiology for further workup and possible Holter monitor.      Relevant Orders   Ambulatory referral to Cardiology   EKG 12-Lead (Completed)   B12 and Folate Panel   Rib pain on right side    No deformity, exquisite tenderness, shortness of breath, cough. Very reassured that right rib pain has improved. Patient would like rib series for peace of mind. Pending x-ray rib. Advised continued conservative therapy at home.       Relevant Orders   DG Ribs Unilateral Right  Other Visit Diagnoses    Encounter for immunization       Relevant Medications   cyanocobalamin ((VITAMIN B-12)) injection 1,000 mcg   Other Relevant Orders   Flu Vaccine QUAD 36+ mos IM (Completed)   CBC with Differential/Platelet   Comprehensive metabolic panel   Hemoglobin A1c   Lipid panel   TSH   VITAMIN D 25 Hydroxy (Vit-D Deficiency, Fractures)   MM DIGITAL SCREENING BILATERAL   Ambulatory referral to Cardiology   DG Ribs Unilateral Right   EKG 12-Lead (Completed)       I have discontinued Ms. Marzo's cyanocobalamin and oxyCODONE-acetaminophen. I am also having her maintain her naproxen sodium. We will continue to administer cyanocobalamin.   Meds ordered this encounter  Medications  . cyanocobalamin ((VITAMIN B-12)) injection 1,000 mcg    Return precautions given.   Risks, benefits, and alternatives of the medications and treatment plan prescribed today were discussed, and patient expressed understanding.   Education regarding symptom management and diagnosis given to patient on AVS.  Continue to follow with Mable Paris, FNP for routine health maintenance.   Flornce L Geraghty and I agreed with plan.   Mable Paris, FNP

## 2016-05-16 NOTE — Patient Instructions (Signed)
Referral to cardiology.  If there is no improvement in your symptoms, or if there is any worsening of symptoms, or if you have any additional concerns, please return for re-evaluation; or, if we are closed, consider going to the Emergency Room for evaluation if symptoms urgent.   Palpitations A palpitation is the feeling that your heartbeat is irregular or is faster than normal. It may feel like your heart is fluttering or skipping a beat. Palpitations are usually not a serious problem. However, in some cases, you may need further medical evaluation. CAUSES  Palpitations can be caused by:  Smoking.  Caffeine or other stimulants, such as diet pills or energy drinks.  Alcohol.  Stress and anxiety.  Strenuous physical activity.  Fatigue.  Certain medicines.  Heart disease, especially if you have a history of irregular heart rhythms (arrhythmias), such as atrial fibrillation, atrial flutter, or supraventricular tachycardia.  An improperly working pacemaker or defibrillator. DIAGNOSIS  To find the cause of your palpitations, your health care provider will take your medical history and perform a physical exam. Your health care provider may also have you take a test called an ambulatory electrocardiogram (ECG). An ECG records your heartbeat patterns over a 24-hour period. You may also have other tests, such as:  Transthoracic echocardiogram (TTE). During echocardiography, sound waves are used to evaluate how blood flows through your heart.  Transesophageal echocardiogram (TEE).  Cardiac monitoring. This allows your health care provider to monitor your heart rate and rhythm in real time.  Holter monitor. This is a portable device that records your heartbeat and can help diagnose heart arrhythmias. It allows your health care provider to track your heart activity for several days, if needed.  Stress tests by exercise or by giving medicine that makes the heart beat faster. TREATMENT    Treatment of palpitations depends on the cause of your symptoms and can vary greatly. Most cases of palpitations do not require any treatment other than time, relaxation, and monitoring your symptoms. Other causes, such as atrial fibrillation, atrial flutter, or supraventricular tachycardia, usually require further treatment. HOME CARE INSTRUCTIONS   Avoid:  Caffeinated coffee, tea, soft drinks, diet pills, and energy drinks.  Chocolate.  Alcohol.  Stop smoking if you smoke.  Reduce your stress and anxiety. Things that can help you relax include:  A method of controlling things in your body, such as your heartbeats, with your mind (biofeedback).  Yoga.  Meditation.  Physical activity such as swimming, jogging, or walking.  Get plenty of rest and sleep. SEEK MEDICAL CARE IF:   You continue to have a fast or irregular heartbeat beyond 24 hours.  Your palpitations occur more often. SEEK IMMEDIATE MEDICAL CARE IF:  You have chest pain or shortness of breath.  You have a severe headache.  You feel dizzy or you faint. MAKE SURE YOU:  Understand these instructions.  Will watch your condition.  Will get help right away if you are not doing well or get worse.   This information is not intended to replace advice given to you by your health care provider. Make sure you discuss any questions you have with your health care provider.   Document Released: 08/04/2000 Document Revised: 08/12/2013 Document Reviewed: 10/06/2011 Elsevier Interactive Patient Education [employer].

## 2016-05-16 NOTE — Assessment & Plan Note (Addendum)
Stable. Follows with Dr Dossie Der. Per chart review, seen 08/2014  When she could not take anti TNFs due to insurance. Considering humira at that time.Continue to monitor.

## 2016-05-16 NOTE — Assessment & Plan Note (Addendum)
Reviewed past medical, social, family history with patient. Patient did not have screening labs as part of physical this year. We have ordered those today. She is up-to-date on colonoscopy, Pap smear. She would like to schedule her mammogram herself, ordered.

## 2016-05-16 NOTE — Assessment & Plan Note (Signed)
No deformity, exquisite tenderness, shortness of breath, cough. Very reassured that right rib pain has improved. Patient would like rib series for peace of mind. Pending x-ray rib. Advised continued conservative therapy at home.

## 2016-05-16 NOTE — Assessment & Plan Note (Addendum)
Reassured by EKG SR. 100 bpm. when compared to EKG 03/2015, no significant changes. No acute signs of ischemia. Concern for arrhythmia as patient describes "flutter". No acute symptoms including shortness of breath, chest pain. Referral placed to cardiology for further workup and possible Holter monitor.

## 2016-05-16 NOTE — Telephone Encounter (Signed)
Pt called to check if the xray results are in from this morning pt is anxious.   Call pt @ [phone]. Thank you!

## 2016-05-16 NOTE — Telephone Encounter (Signed)
Results given to pt

## 2016-05-23 ENCOUNTER — Other Ambulatory Visit: Payer: Self-pay

## 2016-05-30 ENCOUNTER — Ambulatory Visit: Payer: Self-pay | Admitting: Family

## 2016-06-07 ENCOUNTER — Ambulatory Visit: Payer: Self-pay | Admitting: Cardiology

## 2016-06-21 ENCOUNTER — Telehealth: Payer: Self-pay | Admitting: Family

## 2016-06-21 DIAGNOSIS — F17200 Nicotine dependence, unspecified, uncomplicated: Secondary | ICD-10-CM

## 2016-06-21 NOTE — Telephone Encounter (Signed)
Please call patient and let her know that I was reviewing her chart today and with her history of smoking, I would like for her to consider a low-dose CT scan for lung cancer screening. I went ahead and placed order  This is preventative care just like we do mammograms, etc.,   She is welcome to make appointment with me and we can further discuss.

## 2016-06-22 NOTE — Telephone Encounter (Signed)
Left message for patient to return call back.  

## 2016-06-23 NOTE — Telephone Encounter (Signed)
Patient has been informed. She said that would be fine but she is really busy and doesn't know of when she would be able to do so.

## 2016-06-26 ENCOUNTER — Telehealth: Payer: Self-pay | Admitting: *Deleted

## 2016-06-26 ENCOUNTER — Encounter: Payer: Self-pay | Admitting: *Deleted

## 2016-06-26 NOTE — Telephone Encounter (Signed)
Thank you for your understanding!   It definitely brings quitting to forefront with a discussion about cancer.. The silver lining may be we get more to quit if they don't qualify for screening.  Question- do you have probs with medicare people if they meet screening criteria? A lot of patient worry about costs.Marland Kitchen

## 2016-06-26 NOTE — Telephone Encounter (Signed)
Received referral for initial lung cancer screening scan. Contacted patient and obtained smoking history,(less than 1/2 pack per day or less x 43 years) as well as answering questions related to screening process. Discussed that smoking history does not qualify for screening program. Discussed that this is good and means that her risk of lung cancer is less than someone who would qualify. Also discussed at length the importance of smoking cessation when patient is ready to do so. Patient verbalizes interest in quitting and will look into the free [employer] programs available at [facility] health. Patient has my number for any further needs or questions.

## 2016-06-27 ENCOUNTER — Ambulatory Visit: Payer: Self-pay | Admitting: Cardiology

## 2016-06-28 ENCOUNTER — Telehealth: Payer: Self-pay | Admitting: Family

## 2016-06-28 NOTE — Telephone Encounter (Signed)
Please advise for treatment options, per the visit note it looks like she was not wanting treatment at the time. thanks

## 2016-06-28 NOTE — Telephone Encounter (Signed)
Pt called and wanted to know if there is anything else other than Aleve that she can take for her Arthritis and Scoliosis. Pt states that she is bruising from it. Please advise, thank you!  Call pt @ [phone]

## 2016-06-29 ENCOUNTER — Encounter: Payer: Self-pay | Admitting: Family

## 2016-06-29 ENCOUNTER — Ambulatory Visit (INDEPENDENT_AMBULATORY_CARE_PROVIDER_SITE_OTHER): Payer: Medicare HMO | Admitting: Family

## 2016-06-29 VITALS — BP 138/84 | HR 100 | Temp 98.4°F | Wt 156.2 lb

## 2016-06-29 DIAGNOSIS — M069 Rheumatoid arthritis, unspecified: Secondary | ICD-10-CM

## 2016-06-29 DIAGNOSIS — R002 Palpitations: Secondary | ICD-10-CM | POA: Diagnosis not present

## 2016-06-29 DIAGNOSIS — T148XXA Other injury of unspecified body region, initial encounter: Secondary | ICD-10-CM

## 2016-06-29 DIAGNOSIS — Z7689 Persons encountering health services in other specified circumstances: Secondary | ICD-10-CM | POA: Diagnosis not present

## 2016-06-29 DIAGNOSIS — Z23 Encounter for immunization: Secondary | ICD-10-CM

## 2016-06-29 LAB — COMPREHENSIVE METABOLIC PANEL
ALBUMIN: 4.6 g/dL (ref 3.5–5.2)
ALT: 29 U/L (ref 0–35)
AST: 18 U/L (ref 0–37)
Alkaline Phosphatase: 94 U/L (ref 39–117)
BILIRUBIN TOTAL: 0.7 mg/dL (ref 0.2–1.2)
BUN: 22 mg/dL (ref 6–23)
CALCIUM: 9.9 mg/dL (ref 8.4–10.5)
CHLORIDE: 103 meq/L (ref 96–112)
CO2: 32 mEq/L (ref 19–32)
CREATININE: 0.63 mg/dL (ref 0.40–1.20)
GFR: 101.48 mL/min (ref >60.00)
Glucose, Bld: 97 mg/dL (ref 70–99)
Potassium: 4.5 mEq/L (ref 3.5–5.1)
Sodium: 143 mEq/L (ref 135–145)
Total Protein: 7.5 g/dL (ref 6.0–8.3)

## 2016-06-29 LAB — CBC WITH DIFFERENTIAL/PLATELET
BASOS PCT: 0.5 % (ref 0.0–3.0)
Basophils Absolute: 0 10*3/uL (ref 0.0–0.1)
EOS ABS: 0.1 10*3/uL (ref 0.0–0.7)
Eosinophils Relative: 1.4 % (ref 0.0–5.0)
HCT: 45.1 % (ref 36.0–46.0)
HEMOGLOBIN: 15.2 g/dL — AB (ref 12.0–15.0)
Lymphocytes Relative: 22.6 % (ref 12.0–46.0)
Lymphs Abs: 1.9 10*3/uL (ref 0.7–4.0)
MCHC: 33.7 g/dL (ref 30.0–36.0)
MCV: 94 fl (ref 78.0–100.0)
Monocytes Absolute: 0.7 10*3/uL (ref 0.1–1.0)
Monocytes Relative: 8.2 % (ref 3.0–12.0)
NEUTROS ABS: 5.7 10*3/uL (ref 1.4–7.7)
Neutrophils Relative %: 67.3 % (ref 43.0–77.0)
Platelets: 236 10*3/uL (ref 150.0–400.0)
RBC: 4.8 Mil/uL (ref 3.87–5.11)
RDW: 15.3 % (ref 11.5–15.5)
WBC: 8.4 10*3/uL (ref 4.0–10.5)

## 2016-06-29 LAB — LIPID PANEL
CHOL/HDL RATIO: 3
Cholesterol: 320 mg/dL — ABNORMAL HIGH (ref 0–200)
HDL: 117.4 mg/dL (ref >39.00)
LDL Cholesterol: 190 mg/dL — ABNORMAL HIGH (ref 0–99)
NONHDL: 202.32
Triglycerides: 61 mg/dL (ref 0.0–149.0)
VLDL: 12.2 mg/dL (ref 0.0–40.0)

## 2016-06-29 LAB — B12 AND FOLATE PANEL
FOLATE: 8.5 ng/mL (ref >5.9)
VITAMIN B 12: 347 pg/mL (ref 211–911)

## 2016-06-29 LAB — VITAMIN D 25 HYDROXY (VIT D DEFICIENCY, FRACTURES): VITD: 28.74 ng/mL — AB (ref 30.00–100.00)

## 2016-06-29 LAB — TSH: TSH: 1.9 u[IU]/mL (ref 0.35–4.50)

## 2016-06-29 LAB — HEMOGLOBIN A1C: HEMOGLOBIN A1C: 5.6 % (ref 4.6–6.5)

## 2016-06-29 MED ORDER — DULOXETINE HCL 30 MG PO CPEP: ORAL_CAPSULE | ORAL | 1 refills | Status: DC

## 2016-06-29 MED ORDER — OMEPRAZOLE 20 MG PO CPDR: 20.0000 mg | DELAYED_RELEASE_CAPSULE | Freq: Every day | ORAL | 3 refills | Status: DC

## 2016-06-29 NOTE — Patient Instructions (Signed)
Trial cymbalta F/u 3 months or sooner if not improved Labs today prilosec to protect stomach since you take naprosyn  Continue to monitor bruising   If there is no improvement in your symptoms, or if there is any worsening of symptoms, or if you have any additional concerns, please return for re-evaluation; or, if we are closed, consider going to the Emergency Room for evaluation if symptoms urgent.

## 2016-06-29 NOTE — Assessment & Plan Note (Addendum)
Pending cbc. Suspect r/t NSAID use. Advised to use less. Will follow.

## 2016-06-29 NOTE — Assessment & Plan Note (Addendum)
Chronic. Stable. Follows with rheumatology as well. Agreed on trial of Cymbalta for chronic pain a/w RA. Will follow 3 months or sooner if pain not improved. Advised PPI with chronic use of NSAID.

## 2016-06-29 NOTE — Progress Notes (Signed)
Subjective:    Patient ID: Wanda Cervantes, female    DOB: 1951-02-03, 65 y.o.   MRN: [id number]  CC: Wanda Cervantes is a 65 y.o. female who presents today for an acute visit.    HPI: Patient here for follow-up chief complaint bruising 2 weeks ago after slipping on dog ramp outside on left lower leg, improved. Primarily here to discuss alternative options to manage pain from chronic left hip pain and back pain, worsened by RA. Would like to take less naprosyn,  Noticed increased in brusing for years while has been taking naproxyn however thinks it may be worsening. No bleeding from nose or other sites. Takes 2 aleve per day and tries to skip some days. No h/o GIB.    HISTORY:  Past Medical History:  Diagnosis Date  . Anemia   . Anxiety   . Arthritis   . Arthritis    Dr. Ruthell Rummage  . Fibromyalgia   . GERD (gastroesophageal reflux disease)   . Heart murmur   . Hyperlipidemia   . Osteopenia   . Psoriasis with pustules   . Rheumatoid arthritis [facility])    Past Surgical History:  Procedure Laterality Date  . ABDOMINAL HYSTERECTOMY    . ABDOMINAL HYSTERECTOMY  1980  . CARPAL TUNNEL RELEASE Bilateral   . CARPAL TUNNEL RELEASE Right 01/15/2015   Procedure: CARPAL TUNNEL RELEASE, SYNOVECTOMY;  Surgeon: Christophe Louis, MD;  Location: [facility] ORS;  Service: Orthopedics;  Laterality: Right;  . CARPAL TUNNEL RELEASE  2016   both hands, revision 2016  . CARPAL TUNNEL RELEASE Left 03/19/2015   Procedure: CARPAL TUNNEL RELEASE;  Surgeon: Christophe Louis, MD;  Location: [facility] ORS;  Service: Orthopedics;  Laterality: Left;  . COLONOSCOPY WITH PROPOFOL N/A 06/11/2015   Procedure: COLONOSCOPY WITH PROPOFOL;  Surgeon: Manya Silvas, MD;  Location: [facility] ENDOSCOPY;  Service: Endoscopy;  Laterality: N/A;  . EYE SURGERY     age 9  . EYE SURGERY     both eyes  . ULNAR NERVE TRANSPOSITION Left 03/19/2015   Procedure: ULNAR NERVE DECOMPRESSION/TRANSPOSITION;  Surgeon: Christophe Louis, MD;   Location: [facility] ORS;  Service: Orthopedics;  Laterality: Left;   Family History  Problem Relation Age of Onset  . Arthritis Mother   . Hyperlipidemia Mother   . Heart disease Mother   . Hypertension Mother   . Sudden death Mother   . Mental retardation Mother   . Breast cancer Mother 78  . Arthritis Father   . Hyperlipidemia Father   . Heart disease Father   . Hypertension Father   . Mental retardation Father   . Breast cancer Sister     1/2 sister-maternal side    Allergies: Aspirin; Cimzia [certolizumab pegol]; Norco [hydrocodone-acetaminophen]; Statins; and Zantac [ranitidine hcl] Current Outpatient Prescriptions on File Prior to Visit  Medication Sig Dispense Refill  . naproxen sodium (ANAPROX) 220 MG tablet Take 440 mg by mouth 2 (two) times daily as needed (for pain).     No current facility-administered medications on file prior to visit.     Social History  Substance Use Topics  . Smoking status: Current Every Day Smoker    Packs/day: 0.25    Years: 40.00    Types: Cigarettes  . Smokeless tobacco: Never Used  . Alcohol use Yes     Comment: 3-4 glasses wine/day    Review of Systems  Constitutional: Negative for chills and fever.  Respiratory: Negative for cough.   Cardiovascular: Negative for chest pain and  palpitations.  Gastrointestinal: Negative for nausea and vomiting.  Hematological: Bruises/bleeds easily.      Objective:    BP 138/84   Pulse 100   Temp 98.4 F (36.9 C) (Oral)   Wt 156 lb 3.2 oz (70.9 kg)   SpO2 96%   BMI 28.57 kg/m    Physical Exam  Constitutional: She appears well-developed and well-nourished.  Eyes: Conjunctivae are normal.  Cardiovascular: Normal rate, regular rhythm, normal heart sounds and normal pulses.   Pulmonary/Chest: Effort normal and breath sounds normal. She has no wheezes. She has no rhonchi. She has no rales.  Neurological: She is alert.  Skin: Skin is warm and dry.  Localized area of ecchymosis noted LLE.  Well healing scab noted.   Psychiatric: She has a normal mood and affect. Her speech is normal and behavior is normal. Thought content normal.  Vitals reviewed.      Assessment & Plan:   Problem List Items Addressed This Visit      Musculoskeletal and Integument   Rheumatoid arthritis ([place]) - Primary    Chronic. Stable. Follows with rheumatology as well. Agreed on trial of Cymbalta for chronic pain a/w RA. Will follow 3 months or sooner if pain not improved. Advised PPI with chronic use of NSAID.      Relevant Medications   DULoxetine (CYMBALTA) 30 MG capsule   omeprazole (PRILOSEC) 20 MG capsule     Other   Encounter to establish care   Palpitations   Bruising    Pending cbc. Suspect r/t NSAID use. Advised to use less. Will follow.       Other Visit Diagnoses    Encounter for immunization            I am having Wanda Cervantes start on DULoxetine and omeprazole. I am also having her maintain her naproxen sodium.   Meds ordered this encounter  Medications  . DULoxetine (CYMBALTA) 30 MG capsule    Sig: Take one 30 mg tablet by mouth once a day for the first week. Then increase to two 30 mg tablets ( total 60mg ) by mouth once daily.    Dispense:  180 capsule    Refill:  1    Order Specific Question:   Supervising Provider    Answer:   Deborra Medina L [[id number]]  . omeprazole (PRILOSEC) 20 MG capsule    Sig: Take 1 capsule (20 mg total) by mouth daily.    Dispense:  30 capsule    Refill:  3    Order Specific Question:   Supervising Provider    Answer:   Crecencio Mc [[id number]]    Return precautions given.   Risks, benefits, and alternatives of the medications and treatment plan prescribed today were discussed, and patient expressed understanding.   Education regarding symptom management and diagnosis given to patient on AVS.  Continue to follow with Mable Paris, FNP for routine health maintenance.   Wanda Cervantes and I agreed with plan.   Mable Paris,  FNP

## 2016-06-29 NOTE — Progress Notes (Signed)
Pre visit review using our clinic review tool, if applicable. No additional management support is needed unless otherwise documented below in the visit note. 

## 2016-06-30 ENCOUNTER — Telehealth: Payer: Self-pay | Admitting: Family

## 2016-06-30 DIAGNOSIS — M069 Rheumatoid arthritis, unspecified: Secondary | ICD-10-CM

## 2016-06-30 NOTE — Telephone Encounter (Signed)
Called patient but was unable to speak with her. Phone was busy/not in service.  Please advise.

## 2016-06-30 NOTE — Telephone Encounter (Signed)
Pt called and stated that M. Arnett prescribed pt a medication yesterday. Pt went to pick the medication and she said that it was $200. Is there anything else that can be prescribed. Please advise, thank you!

## 2016-07-02 ENCOUNTER — Other Ambulatory Visit: Payer: Self-pay | Admitting: Family

## 2016-07-02 DIAGNOSIS — M0520 Rheumatoid vasculitis with rheumatoid arthritis of unspecified site: Secondary | ICD-10-CM

## 2016-07-02 MED ORDER — MELOXICAM 7.5 MG PO TABS: 7.5000 mg | ORAL_TABLET | Freq: Every day | ORAL | 1 refills | Status: DC

## 2016-07-02 NOTE — Telephone Encounter (Signed)
Call pt again-  Stop cymbalta if so expensive  Trial Mobic 7.5mg  QD.  I have also discontinued naprosyn as she should not take both Mobic and naprosyn. Mobic is in the same family as the NSAID and can cause GI bleed/ulcer/upset. Please take with food and since you are taking Mobic everyday, I would take prilosec everyday.  When does she see rheumatology? Does she need a referral, they may have better, targeted therapies for her RA.

## 2016-07-03 ENCOUNTER — Telehealth: Payer: Self-pay | Admitting: Family

## 2016-07-03 NOTE — Telephone Encounter (Signed)
Pt called to get her lab results. Please advise?  Call pt @ [phone]. Thank you!

## 2016-07-04 ENCOUNTER — Ambulatory Visit: Payer: Self-pay | Admitting: Family Medicine

## 2016-07-04 NOTE — Telephone Encounter (Signed)
Patient would like the referral but for it to be in [place], also not with Dr. Jefm Bryant. Or at that office.  Patient was informed of medication change. Please advise.

## 2016-07-04 NOTE — Telephone Encounter (Signed)
Left message for patient to return call back.  

## 2016-07-04 NOTE — Telephone Encounter (Signed)
Patient has been informed.

## 2016-07-05 ENCOUNTER — Telehealth: Payer: Self-pay | Admitting: *Deleted

## 2016-07-05 NOTE — Telephone Encounter (Signed)
fyi Referral placed

## 2016-07-05 NOTE — Telephone Encounter (Signed)
Patient requested lab results Pt contact [phone]

## 2016-07-06 NOTE — Telephone Encounter (Signed)
See result note.  

## 2016-07-06 NOTE — Telephone Encounter (Signed)
Results have been given to patient and also mailed. I believe this is an old returned call.

## 2016-07-10 DIAGNOSIS — M545 Low back pain: Secondary | ICD-10-CM | POA: Diagnosis not present

## 2016-07-11 ENCOUNTER — Telehealth: Payer: Self-pay | Admitting: Family

## 2016-07-11 NOTE — Telephone Encounter (Signed)
Please advise 

## 2016-07-11 NOTE — Telephone Encounter (Signed)
Pt called about receiving a phone call from the rheumatologist office pt states that the rheumatologist cannot help her. Pt went to see a doctor yesterday she had a xray she was not sure what kind of doctor Wanda Cervantes at [facility] he was and she will need a hip replacement in 2-3 years. So a rheumatologist cannot do that. Please advise?  Call pt @ [phone]. Thank you!

## 2016-07-12 NOTE — Telephone Encounter (Signed)
Spoke with patient-  She will try mobic and prilosec cymbalta too expensive  Had see Dr Roland Rack- ortho this past week who recommended glucosamine for joint pain.   Didn't like prior Dr Dossie Der rheumatologist and tried methatrexate.  Hold off on rheum referral for now.

## 2016-07-27 ENCOUNTER — Ambulatory Visit
Admission: RE | Admit: 2016-07-27 | Discharge: 2016-07-27 | Disposition: A | Discharge status: Home / Self Care | Payer: Medicare HMO | Source: Ambulatory Visit | Attending: Family | Admitting: Family

## 2016-07-27 DIAGNOSIS — Z7689 Persons encountering health services in other specified circumstances: Secondary | ICD-10-CM

## 2016-07-27 DIAGNOSIS — Z23 Encounter for immunization: Secondary | ICD-10-CM

## 2016-07-27 DIAGNOSIS — Z1231 Encounter for screening mammogram for malignant neoplasm of breast: Secondary | ICD-10-CM | POA: Insufficient documentation

## 2016-08-02 IMAGING — CT CT ABD-PELV W/ CM
2 of 5 series · 16 of 46 positions shown, 18 images · IV contrast (omnipaque)
Comparison: 04/06/2014

CLINICAL DATA: Left lower quadrant abdominal pain. No nausea,
vomiting, or diarrhea.

EXAM:
CT ABDOMEN AND PELVIS WITH CONTRAST
TECHNIQUE: Multidetector CT imaging of the abdomen and pelvis was performed
using the standard protocol following bolus administration of
intravenous contrast.
CONTRAST:  100mL OMNIPAQUE IOHEXOL 300 MG/ML  SOLN

[Series 2: routine abd pel with · axial · 0.86mm/px · z∈[-1049,-629]mm · 13 of 94 slices shown, 15 images]
[im 5/94  soft-tissue]
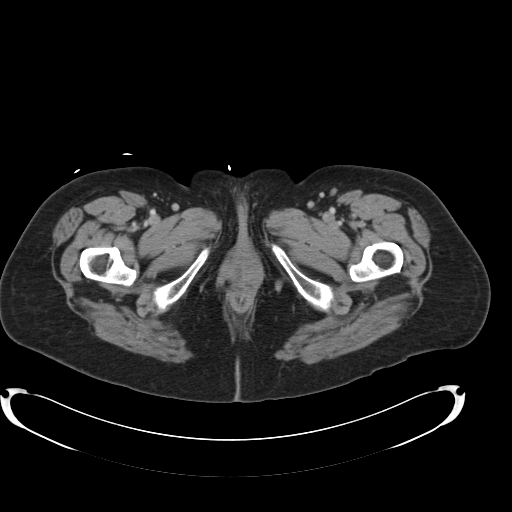
[im 5/94  bone]
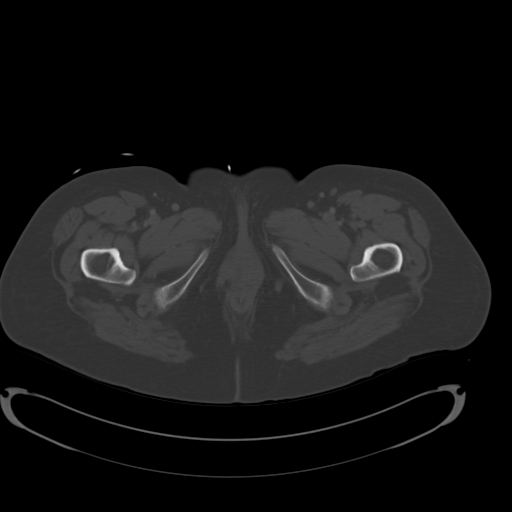
[im 15/94  soft-tissue]
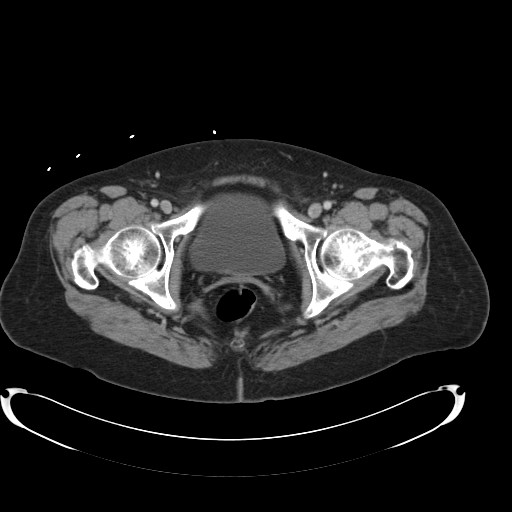
[im 20/94  soft-tissue]
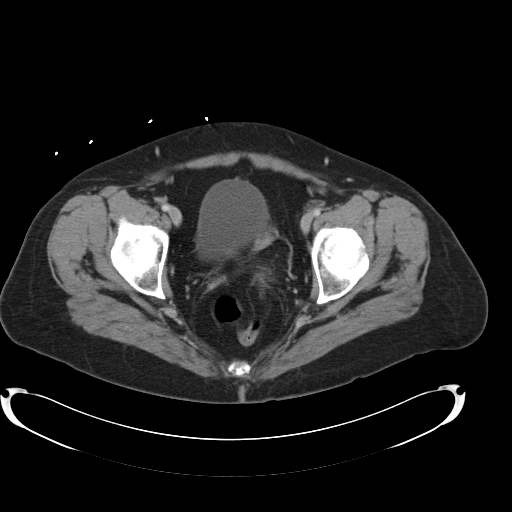
[im 25/94  soft-tissue]
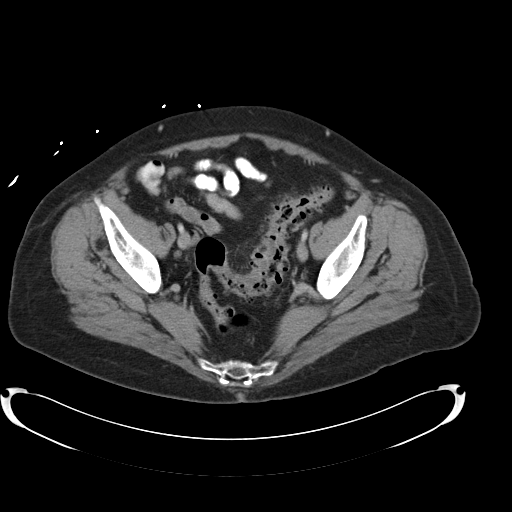
[im 35/94  soft-tissue]
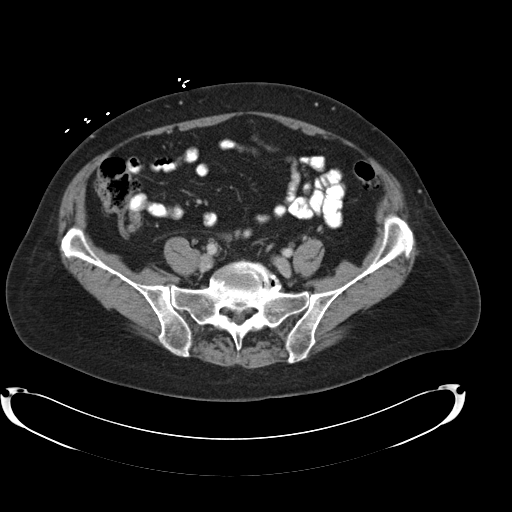
[im 40/94  soft-tissue]
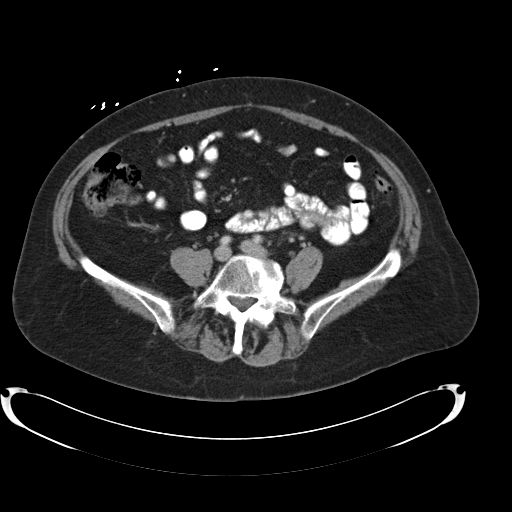
[im 49/94  soft-tissue]
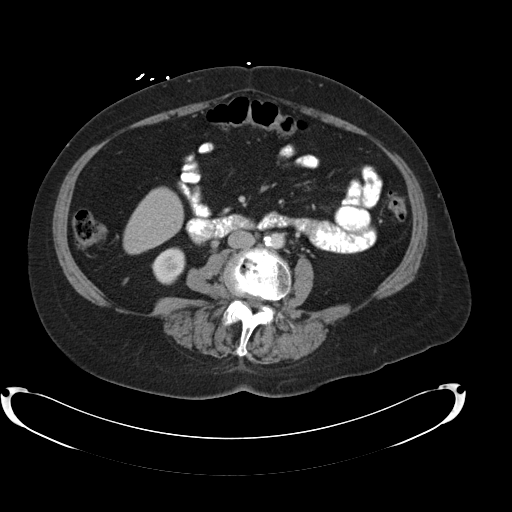
[im 54/94  soft-tissue]
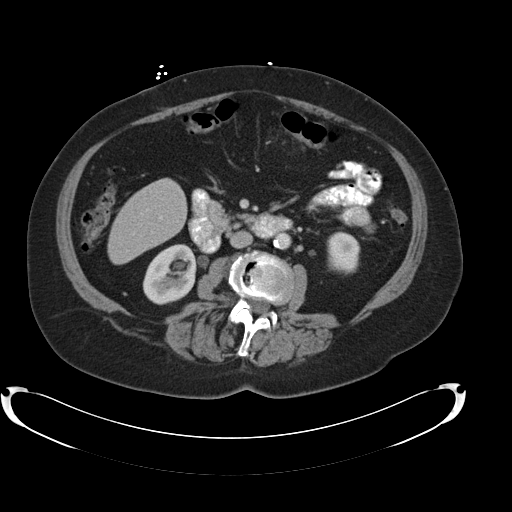
[im 59/94  soft-tissue]
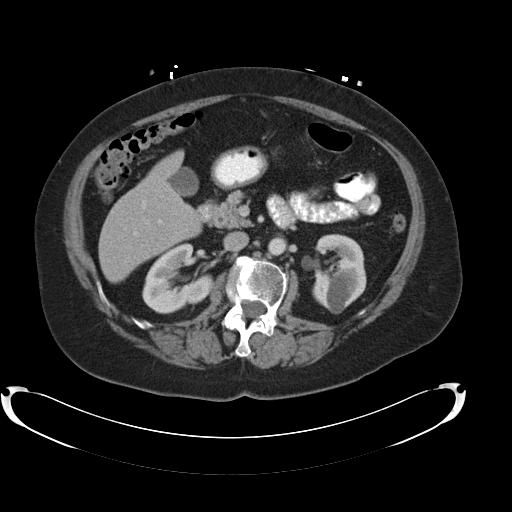
[im 59/94  bone]
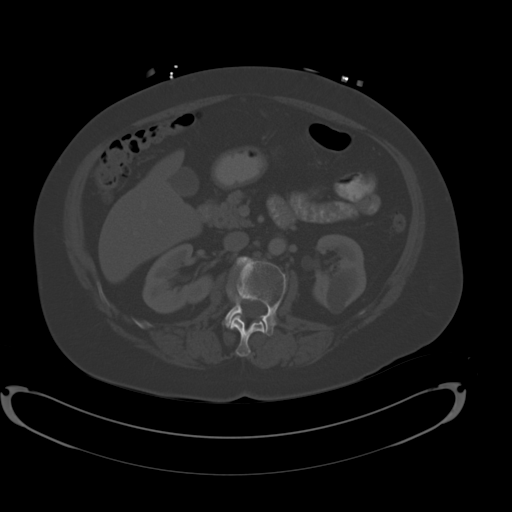
[im 69/94  soft-tissue]
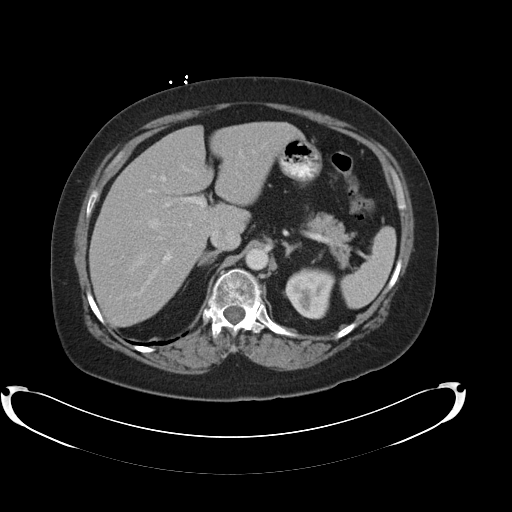
[im 74/94  soft-tissue]
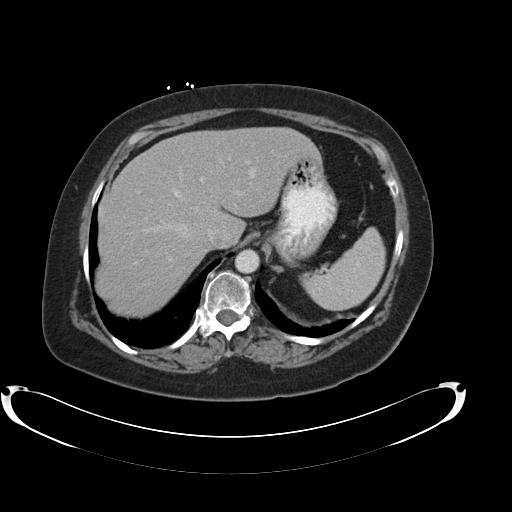
[im 79/94  soft-tissue]
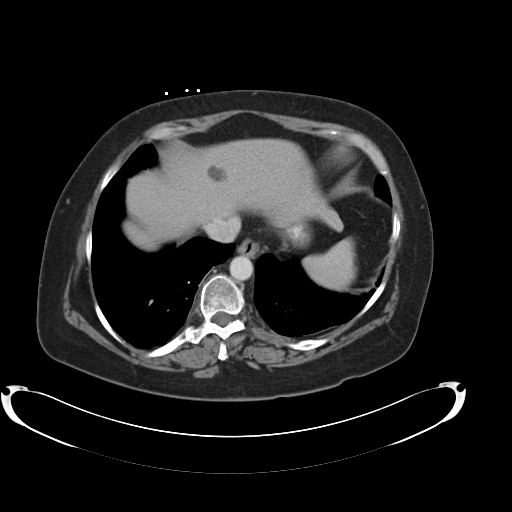
[im 89/94  soft-tissue]
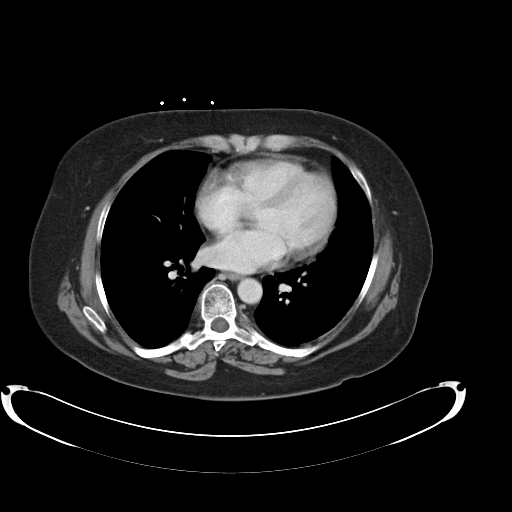

[Series 5: cor routine abd pel with · coronal · 0.79mm/px · 3 of 160 slices shown]
[im 54/160  soft-tissue]
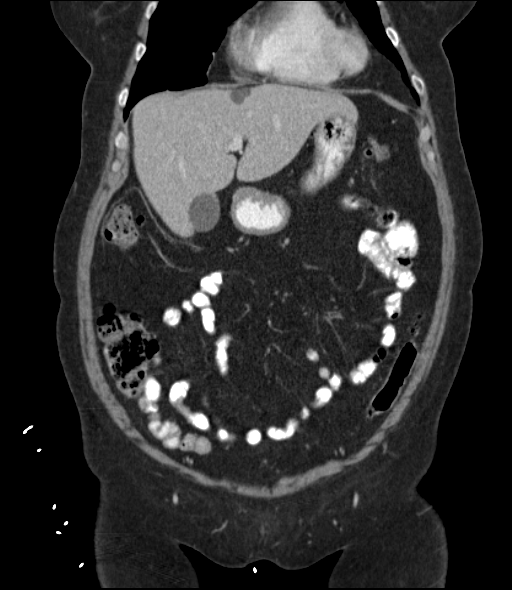
[im 71/160  soft-tissue]
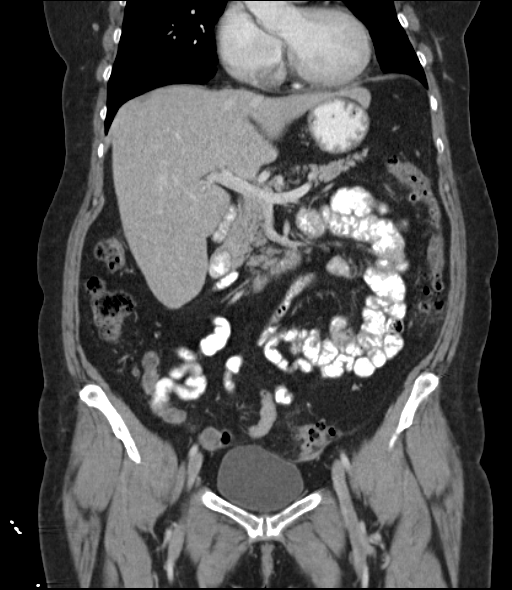
[im 89/160  soft-tissue]
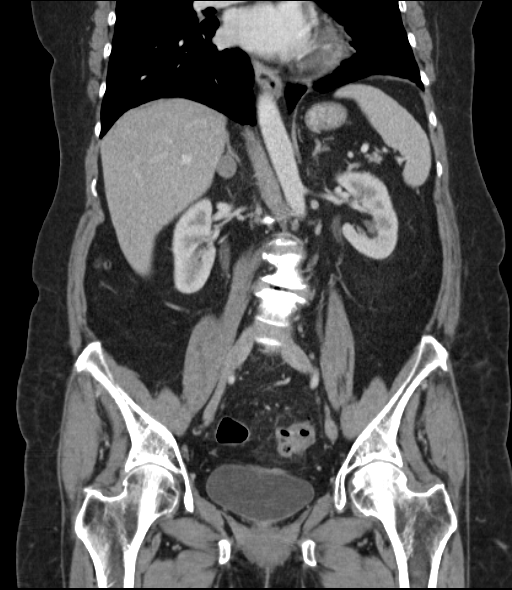

[16 of 46 positions shown; findings below may reference images not displayed]

FINDINGS: Lower chest: Lung bases are unremarkable. Heart size is normal.
Small fat containing posterior diaphragmatic hernia incidentally
noted.

Upper abdomen: Small cyst is identified within the dome of the left
hepatic lobe. A 1.6 cm right adrenal adenoma is again noted. There
are bilateral renal cysts. Left adrenal gland has a normal
appearance. The gallbladder is present.

Gastrointestinal tract: The stomach and small bowel loops are normal
in appearance. The appendix is well seen and has a normal
appearance.

There are numerous colonic diverticula. There is stranding
surrounding the sigmoid segment consistent with acute
diverticulitis. There is no associated perforation or abscess. No
evidence for colonic obstruction.

Pelvis: The uterus is absent.  There is no adnexal mass.

Retroperitoneum: There is atherosclerotic calcification of the
abdominal aorta. No aneurysm. No retroperitoneal or mesenteric
adenopathy.

Abdominal wall:  Unremarkable.

Osseous structures: Scoliosis associated with degenerative changes.
No suspicious lytic or blastic lesions are identified.
IMPRESSION: 1. Significant colonic diverticulosis.
2. Acute inflammation in the region of the sigmoid colon consistent
with acute diverticulitis.
3. No abscess or free air.
4. Stable and benign right adrenal adenoma.
5. Bilateral renal cysts.
6. Normal appendix.

## 2016-08-28 ENCOUNTER — Ambulatory Visit: Payer: Self-pay | Admitting: Family

## 2016-08-28 NOTE — Progress Notes (Deleted)
Subjective:    Patient ID: Wanda Cervantes, female    DOB: 05/31/1951, 66 y.o.   MRN: [id number]  CC: Wanda Cervantes is a 66 y.o. female who presents today for an acute visit.    HPI: CC: neck pain      HISTORY:  Past Medical History:  Diagnosis Date  . Anemia   . Anxiety   . Arthritis   . Arthritis    Dr. Ruthell Rummage  . Fibromyalgia   . GERD (gastroesophageal reflux disease)   . Heart murmur   . Hyperlipidemia   . Osteopenia   . Psoriasis with pustules   . Rheumatoid arthritis [facility])    Past Surgical History:  Procedure Laterality Date  . ABDOMINAL HYSTERECTOMY    . ABDOMINAL HYSTERECTOMY  1980  . CARPAL TUNNEL RELEASE Bilateral   . CARPAL TUNNEL RELEASE Right 01/15/2015   Procedure: CARPAL TUNNEL RELEASE, SYNOVECTOMY;  Surgeon: Christophe Louis, MD;  Location: [facility] ORS;  Service: Orthopedics;  Laterality: Right;  . CARPAL TUNNEL RELEASE  2016   both hands, revision 2016  . CARPAL TUNNEL RELEASE Left 03/19/2015   Procedure: CARPAL TUNNEL RELEASE;  Surgeon: Christophe Louis, MD;  Location: [facility] ORS;  Service: Orthopedics;  Laterality: Left;  . COLONOSCOPY WITH PROPOFOL N/A 06/11/2015   Procedure: COLONOSCOPY WITH PROPOFOL;  Surgeon: Manya Silvas, MD;  Location: [facility] ENDOSCOPY;  Service: Endoscopy;  Laterality: N/A;  . EYE SURGERY     age 50  . EYE SURGERY     both eyes  . ULNAR NERVE TRANSPOSITION Left 03/19/2015   Procedure: ULNAR NERVE DECOMPRESSION/TRANSPOSITION;  Surgeon: Christophe Louis, MD;  Location: [facility] ORS;  Service: Orthopedics;  Laterality: Left;   Family History  Problem Relation Age of Onset  . Arthritis Mother   . Hyperlipidemia Mother   . Heart disease Mother   . Hypertension Mother   . Sudden death Mother   . Mental retardation Mother   . Breast cancer Mother 36  . Arthritis Father   . Hyperlipidemia Father   . Heart disease Father   . Hypertension Father   . Mental retardation Father   . Breast cancer Sister     1/2 sister-maternal  side    Allergies: Aspirin; Cimzia [certolizumab pegol]; Norco [hydrocodone-acetaminophen]; Statins; and Zantac [ranitidine hcl] Current Outpatient Prescriptions on File Prior to Visit  Medication Sig Dispense Refill  . meloxicam (MOBIC) 7.5 MG tablet Take 1 tablet (7.5 mg total) by mouth daily. 90 tablet 1  . omeprazole (PRILOSEC) 20 MG capsule Take 1 capsule (20 mg total) by mouth daily. 30 capsule 3   No current facility-administered medications on file prior to visit.     Social History  Substance Use Topics  . Smoking status: Current Every Day Smoker    Packs/day: 0.25    Years: 40.00    Types: Cigarettes  . Smokeless tobacco: Never Used  . Alcohol use Yes     Comment: 3-4 glasses wine/day    Review of Systems    Objective:    There were no vitals taken for this visit.   Physical Exam     Assessment & Plan:      I am having Wanda Cervantes maintain her omeprazole and meloxicam.   No orders of the defined types were placed in this encounter.   Return precautions given.   Risks, benefits, and alternatives of the medications and treatment plan prescribed today were discussed, and patient expressed understanding.   Education regarding symptom management and  diagnosis given to patient on AVS.  Continue to follow with Mable Paris, FNP for routine health maintenance.   Wanda Cervantes and I agreed with plan.   Mable Paris, FNP

## 2016-09-05 ENCOUNTER — Telehealth: Payer: Self-pay | Admitting: Family

## 2016-09-05 DIAGNOSIS — M5441 Lumbago with sciatica, right side: Principal | ICD-10-CM

## 2016-09-05 DIAGNOSIS — M5442 Lumbago with sciatica, left side: Principal | ICD-10-CM

## 2016-09-05 DIAGNOSIS — G8929 Other chronic pain: Secondary | ICD-10-CM

## 2016-09-05 NOTE — Telephone Encounter (Signed)
Pt called and needs some advise, she has been c/o her legs going numb and not being able to feel her skin. She states that it has been been going on off and on and has been getting more frequent. She notices it more when she is on her feet more. She feels as though she should go to a Neurologist. Please advise, thank you!  Call pt @ [phone]

## 2016-09-05 NOTE — Telephone Encounter (Signed)
Reason for call: bilateral leg numb numbness Symptoms: bilateral leg numbness, right arm numbness, no headache, no slurred speech, low back pain , whole leg goes numbness can't feel skin  Duration 7-10 minutes of numbness sometimes longer  Medications: meloxicam, tumeric otc Has an appointment with neurologist with 10/10/16 she scheduled please advise.

## 2016-09-08 NOTE — Telephone Encounter (Signed)
Spoke with pt-  Over a year, intermittent LE numbness ( goes  Between both legs), heaviness. Occasional right arm.  No acute worsening.   Has seen Dr Sharlet Salina for injections.   Would like to see local neurosurgeon- messaged Lenna Sciara to get a name.  Pt Will likely cancel appt with Dr Brigitte Pulse

## 2016-09-11 ENCOUNTER — Telehealth: Payer: Self-pay | Admitting: *Deleted

## 2016-09-11 NOTE — Telephone Encounter (Signed)
Pt has requested to know if she can receive er Cortizone injections here in the office, or if she should continue using her referral to [facility]. Pt contact [phone]

## 2016-09-11 NOTE — Telephone Encounter (Signed)
Call pt-  Placed referral to [facility],   She may cancel appt with Dr Manuella Ghazi if she would like since he is neurology  fyi -  [name], this pt specially requests staying in [place]. Thanks!

## 2016-09-12 NOTE — Telephone Encounter (Signed)
Please advise 

## 2016-09-12 NOTE — Telephone Encounter (Signed)
Patient has been notified

## 2016-09-12 NOTE — Telephone Encounter (Signed)
[facility] is not in [place]. I can refer her to [facility] Neurosurgery.

## 2016-09-12 NOTE — Telephone Encounter (Signed)
Wanda Cervantes,  That would be great. Thank you  Fransisco Beau,  Please let pt know of [facility] neurosurgery referral

## 2016-09-12 NOTE — Telephone Encounter (Signed)
Must be done at [facility]  Please let pt know

## 2016-09-13 ENCOUNTER — Ambulatory Visit (INDEPENDENT_AMBULATORY_CARE_PROVIDER_SITE_OTHER): Payer: Medicare HMO | Admitting: Family Medicine

## 2016-09-13 ENCOUNTER — Encounter: Payer: Self-pay | Admitting: Family Medicine

## 2016-09-13 VITALS — BP 140/80 | HR 108 | Temp 97.6°F | Ht 62.0 in | Wt 158.0 lb

## 2016-09-13 DIAGNOSIS — M05711 Rheumatoid arthritis with rheumatoid factor of right shoulder without organ or systems involvement: Secondary | ICD-10-CM | POA: Diagnosis not present

## 2016-09-13 DIAGNOSIS — M05712 Rheumatoid arthritis with rheumatoid factor of left shoulder without organ or systems involvement: Secondary | ICD-10-CM | POA: Diagnosis not present

## 2016-09-13 MED ORDER — METHYLPREDNISOLONE ACETATE 40 MG/ML IJ SUSP
80.0000 mg | Freq: Once | INTRAMUSCULAR | Status: AC
  Administered 2016-09-13: 80 mg via INTRA_ARTICULAR

## 2016-09-13 NOTE — Progress Notes (Signed)
   Dr. Frederico Hamman T. Kyera Felan, MD, [facility] [place], [address] Phone: [phone] Fax: [phone]  09/13/2016  Patient: Wanda Cervantes, MRN: [id number], DOB: 1950/10/05, 66 y.o.  Primary Physician:  Mable Paris, FNP   Chief Complaint  Patient presents with  . Shoulder Pain    Wants Bilateral Shoulder Injections   Subjective:   Toluwanimi L Brilliant is a 66 y.o. very pleasant female patient who presents with the following:  Shoulders, B: Known RA, responsive to steroid injections in the past.  Intrarticular Shoulder Injection, R Verbal consent was obtained from the patient. Risks including infection explained and contrasted with benefits and alternatives. Patient prepped with Chloraprep and Ethyl Chloride used for anesthesia. An intraarticular shoulder injection was performed using the posterior approach. The patient tolerated the procedure well and had decreased pain post injection. No complications. Injection: 8 cc of Lidocaine 1% and 2 mL Depo-Medrol 40 mg. Needle: 22 gauge   Intrarticular Shoulder Injection, L Verbal consent was obtained from the patient. Risks including infection explained and contrasted with benefits and alternatives. Patient prepped with Chloraprep and Ethyl Chloride used for anesthesia. An intraarticular shoulder injection was performed using the posterior approach. The patient tolerated the procedure well and had decreased pain post injection. No complications. Injection: 8 cc of Lidocaine 1% and 2 mL Depo-Medrol 40 mg. Needle: 22 gauge   Signed,  Oyinkansola Truax T. Sakeenah Valcarcel, MD

## 2016-09-13 NOTE — Progress Notes (Signed)
Pre visit review using our clinic review tool, if applicable. No additional management support is needed unless otherwise documented below in the visit note. 

## 2016-09-14 ENCOUNTER — Telehealth: Payer: Self-pay

## 2016-09-14 NOTE — Telephone Encounter (Signed)
Pt left v/m; pt was seen 09/13/16 and shoulders are better; pt can raise her hand and is very pleased.This morning pts face was red or flushed and pt wants to know if that could be due to pt getting 2 cortisone shots on 09/13/16. Face not as red now. Pt request cb.

## 2016-09-14 NOTE — Telephone Encounter (Signed)
That is likely what it is from. With 2 injections there is a reasonable amount of medication that is also absorbed systemically. This not likely a true allergy but more a dose effect.  Please let me know if it worsens.

## 2016-09-14 NOTE — Telephone Encounter (Signed)
Ms. Gery notified as instructed by telephone.

## 2016-09-18 NOTE — Telephone Encounter (Signed)
Were you able to call pt to let her know about referral to [facility] neurosurgery?

## 2016-09-19 NOTE — Telephone Encounter (Signed)
Patient was notified of referral.

## 2016-09-26 ENCOUNTER — Telehealth: Payer: Self-pay | Admitting: Family

## 2016-09-26 DIAGNOSIS — M5431 Sciatica, right side: Secondary | ICD-10-CM

## 2016-09-26 DIAGNOSIS — M5432 Sciatica, left side: Secondary | ICD-10-CM

## 2016-09-26 NOTE — Telephone Encounter (Signed)
Unable to place VM for return call back.  VM was full.

## 2016-09-26 NOTE — Telephone Encounter (Signed)
-----   Message from Babs Bertin, [place] sent at 09/26/2016  7:48 AM EST ----- Regarding: FW: Neurosurgery appointment Please advise. ----- Message ----- From: Berdine Addison Sent: 09/25/2016   4:46 PM To: Babs Bertin, CMA Subject: Neurosurgery appointment                       Hi Fransisco Beau, thank you for the referral to [facility] Neurosurgery. I am the registered nurse that works with [facility].  I noticed that this patient has an appointment on 10/19/16 with Dr Rogue Jury Abd-El-Barr. If your doctor would be willing to order/schedule this patient an MRI to be done prior to her appointment, it would be greatly appreciated - it would save the patient a copay/appointment/time if she had the MRI prior to her appointment with [place]. If we need to reschedule her appointment to a later date, just let me know - but I imagine it shouldn't be an issue to get an MRI scheduled before 10/19/16. Please do not hesitate to message me and/or call me if you have any questions/issues [phone]) Thank you in advance.  Kindest regards, Berdine Addison, RN

## 2016-09-26 NOTE — Telephone Encounter (Signed)
Call pt-  She has an appt with Dr. Jenna Luo 3/1.  They are advising MRI prior to appt.   Would you clarify which area of back patient has pain so I can put in appropriate order?  Cervical pain? Thoracic or lumbar?  Thanks!

## 2016-09-29 NOTE — Telephone Encounter (Signed)
Unable to place VM for return call back.  VM was full

## 2016-09-29 NOTE — Telephone Encounter (Signed)
Spoken to patient. Patient stated they are trying to find out why her legs and right arm is going numb.  She has SX from neck down.  Please advise.

## 2016-10-02 ENCOUNTER — Encounter: Payer: Self-pay | Admitting: Family

## 2016-10-02 NOTE — Telephone Encounter (Signed)
MRI placed for lumbar and cervical spine  Call pt  These should be done prior to appt with dr Sena Hitch.   Please ensure no pacemaker, metal. Didn't see any in patient history

## 2016-10-02 NOTE — Telephone Encounter (Signed)
Tried calling patient voicemail box full on cell

## 2016-10-02 NOTE — Telephone Encounter (Signed)
Pt states that she is not having the MRI done because insurance doesn't cover it 100%.. Wants to know what the next step is.Marland Kitchen

## 2016-10-02 NOTE — Telephone Encounter (Signed)
Spoke with patient verified no pacemaker or metal in body per patient

## 2016-10-04 NOTE — Telephone Encounter (Signed)
Please call pt-  She may call [facility] and let them know she is unable to get the MRI due to insurance.   Berdine Addison is the RN who called about scheduling the mri prior too.

## 2016-10-05 ENCOUNTER — Telehealth: Payer: Self-pay | Admitting: Family

## 2016-10-05 NOTE — Telephone Encounter (Signed)
Patient has been informed. Insurance stated they will pay 80%. Patient is gonna wait for insurance to have call back to tell her how much it will have to be before doing so.

## 2016-10-05 NOTE — Telephone Encounter (Signed)
Pt has requested call to understand Insurance payment of the MRI Pt contact [phone]

## 2016-10-05 NOTE — Telephone Encounter (Signed)
[employer] called and stated that the patient called really upset because they had denied her MRI. Agent stated that pt does not need a referral or a prior authorization for her MRI, because she is a PPO member. All she needs is to take an order to a in network facility and pt would have a 20% coinsurance.   Provider Services 800 [phone]

## 2016-10-05 NOTE — Telephone Encounter (Signed)
Pt called back returning your call. Thank you!  Call pt @ [phone]

## 2016-10-05 NOTE — Telephone Encounter (Signed)
Tried to leave VM but was unable to do so due to VM being full.

## 2016-10-11 ENCOUNTER — Telehealth: Payer: Self-pay | Admitting: *Deleted

## 2016-10-11 ENCOUNTER — Telehealth: Payer: Self-pay | Admitting: Internal Medicine

## 2016-10-11 NOTE — Telephone Encounter (Signed)
Pt questioned if she should have a colonoscopy this year. Pt was unclear when she had her last one Pt contact  [phone]

## 2016-10-12 NOTE — Telephone Encounter (Signed)
Patient has been notified that she will need to repeat October 2019.

## 2016-10-13 ENCOUNTER — Ambulatory Visit: Payer: Medicare HMO

## 2016-10-13 ENCOUNTER — Other Ambulatory Visit: Payer: Self-pay

## 2016-10-16 ENCOUNTER — Ambulatory Visit: Payer: Medicare HMO

## 2016-10-23 ENCOUNTER — Telehealth: Payer: Self-pay | Admitting: Family

## 2016-10-23 NOTE — Telephone Encounter (Signed)
Spoke with pt and she stated that she would do the cardiology appt this time. She also stated that she had a referral to neurosurgeon. She stated that they wanted her to have an MRI done before they would see her but the pt's insurance would not cover the MRI. Told the pt to call the neurosurgeons office and see if there is any other options so they can still see her.

## 2016-10-23 NOTE — Telephone Encounter (Signed)
Call pt  I received form for gym.   I reviewed chart and realized she never went to cardiology in fall for palpitations.   I placed the referral- did she decide not to go?  I would like for her to evaluate this prior to an exercise program.

## 2016-10-24 NOTE — Telephone Encounter (Signed)
Thank you noted.

## 2016-10-27 ENCOUNTER — Telehealth: Payer: Self-pay | Admitting: Family

## 2016-10-27 ENCOUNTER — Ambulatory Visit: Payer: Self-pay | Admitting: Family Medicine

## 2016-10-27 MED ORDER — CYCLOBENZAPRINE HCL 10 MG PO TABS: 10.0000 mg | ORAL_TABLET | Freq: Three times a day (TID) | ORAL | 0 refills | Status: DC | PRN

## 2016-10-27 NOTE — Telephone Encounter (Signed)
Patient ntoified and voiced understanding .Wanda Cervantes Stated she will try 0.5 tablet before taking whole tablet.

## 2016-10-27 NOTE — Telephone Encounter (Signed)
Patient has rheumatoid arthritis and when she has a flare up she cannot put weight on her left leg at all. Patient also has had a stiff neck and upper part of back is real sore. Patient believes she has strained the muscles in neck and back lifting her blind dog patient stated she cannot drive to come into office and that has no transportation. Patient requesting flexeril?

## 2016-10-27 NOTE — Telephone Encounter (Signed)
Yes, rx for flexeril sent

## 2016-10-27 NOTE — Telephone Encounter (Signed)
FYI - Pt called and stated that she woke this morning and stated that she is having issues walking due to other medical issues. Pt is still having headaches. Wanted to know if something can be called in. Please advise, thank you!   Call pt @ [phone]

## 2016-10-27 NOTE — Telephone Encounter (Signed)
Please advise 

## 2016-10-31 ENCOUNTER — Telehealth: Payer: Self-pay

## 2016-10-31 DIAGNOSIS — M5431 Sciatica, right side: Secondary | ICD-10-CM

## 2016-10-31 DIAGNOSIS — M5432 Sciatica, left side: Secondary | ICD-10-CM

## 2016-10-31 NOTE — Telephone Encounter (Signed)
Patient stated that the flexeril is making patient too sleepy and not helping with her pain.  Patient is wondering if she can have prednisone due to past HX.  Please advise.

## 2016-11-01 MED ORDER — PREDNISONE 10 MG PO TABS: ORAL_TABLET | ORAL | 0 refills | Status: DC

## 2016-11-01 NOTE — Telephone Encounter (Signed)
Pt called looking for an update. Pt would like to be called when this is completed. Thank you!  Call pt @ [phone]

## 2016-11-01 NOTE — Telephone Encounter (Signed)
Pt has been notified.

## 2016-11-01 NOTE — Addendum Note (Signed)
Addended by: Burnard Hawthorne on: 11/01/2016 02:50 PM   Modules accepted: Orders

## 2016-11-01 NOTE — Telephone Encounter (Signed)
PT called back again to see if Wanda Cervantes is going to fill this medication. Pt states that she is in a really bad way. Please advise, thank you!

## 2016-11-01 NOTE — Telephone Encounter (Signed)
Faxed now  Please let patient know amanda

## 2016-11-06 ENCOUNTER — Ambulatory Visit (INDEPENDENT_AMBULATORY_CARE_PROVIDER_SITE_OTHER): Payer: Medicare HMO | Admitting: Family

## 2016-11-06 ENCOUNTER — Encounter: Payer: Self-pay | Admitting: Family

## 2016-11-06 VITALS — BP 146/88 | HR 112 | Temp 96.0°F | Ht 62.0 in | Wt 155.2 lb

## 2016-11-06 DIAGNOSIS — M542 Cervicalgia: Secondary | ICD-10-CM | POA: Diagnosis not present

## 2016-11-06 MED ORDER — GABAPENTIN 100 MG PO CAPS: 300.0000 mg | ORAL_CAPSULE | Freq: Every evening | ORAL | 3 refills | Status: DC | PRN

## 2016-11-06 NOTE — Assessment & Plan Note (Signed)
Symptoms consistent with neck spasm. Patient jointly agreed no imaging was necessary today. Suspect  injury related. And also due to stress. Due to patient's history of low back pain, reasonable to trial gabapentin. Advised patient to stop prednisone immediatelyas jointly agreed likely elevating her heart rate. No chest pain, shortness of breath.

## 2016-11-06 NOTE — Progress Notes (Addendum)
Subjective:    Patient ID: Wanda Cervantes, female    DOB: 1951-06-27, 66 y.o.   MRN: [id number]  CC: Wanda Cervantes is a 66 y.o. female who presents today for an acute visit.    HPI: Chief complaint of acute neck pain x 3 weeks, improved. Onset correlates when dog got sick and had to carry him outside to use the bathroom 10+ itmes per day after dog had stroke. Describes as neck spasm. Also complains of low back pain. Started prednisone last week and a seen great improvement with pain and movement of arms( which prior, moving arms would hurt neck.). No numbness, tingling, HA. Mobic doesn't help.   h/o RA.   No history of cancer    Dr Roland Rack 06/2016; dr Sharlet Salina 09/23/16  HISTORY:  Past Medical History:  Diagnosis Date  . Anemia   . Anxiety   . Arthritis   . Arthritis    Dr. Ruthell Rummage  . Fibromyalgia   . GERD (gastroesophageal reflux disease)   . Heart murmur   . Hyperlipidemia   . Osteopenia   . Psoriasis with pustules   . Rheumatoid arthritis [facility])    Past Surgical History:  Procedure Laterality Date  . ABDOMINAL HYSTERECTOMY    . ABDOMINAL HYSTERECTOMY  1980  . CARPAL TUNNEL RELEASE Bilateral   . CARPAL TUNNEL RELEASE Right 01/15/2015   Procedure: CARPAL TUNNEL RELEASE, SYNOVECTOMY;  Surgeon: Christophe Louis, MD;  Location: [facility] ORS;  Service: Orthopedics;  Laterality: Right;  . CARPAL TUNNEL RELEASE  2016   both hands, revision 2016  . CARPAL TUNNEL RELEASE Left 03/19/2015   Procedure: CARPAL TUNNEL RELEASE;  Surgeon: Christophe Louis, MD;  Location: [facility] ORS;  Service: Orthopedics;  Laterality: Left;  . COLONOSCOPY WITH PROPOFOL N/A 06/11/2015   Procedure: COLONOSCOPY WITH PROPOFOL;  Surgeon: Manya Silvas, MD;  Location: [facility] ENDOSCOPY;  Service: Endoscopy;  Laterality: N/A;  . EYE SURGERY     age 80  . EYE SURGERY     both eyes  . ULNAR NERVE TRANSPOSITION Left 03/19/2015   Procedure: ULNAR NERVE DECOMPRESSION/TRANSPOSITION;  Surgeon: Christophe Louis, MD;   Location: [facility] ORS;  Service: Orthopedics;  Laterality: Left;   Family History  Problem Relation Age of Onset  . Arthritis Mother   . Hyperlipidemia Mother   . Heart disease Mother   . Hypertension Mother   . Sudden death Mother   . Mental retardation Mother   . Breast cancer Mother 56  . Arthritis Father   . Hyperlipidemia Father   . Heart disease Father   . Hypertension Father   . Mental retardation Father   . Breast cancer Sister     1/2 sister-maternal side    Allergies: Aspirin; Cimzia [certolizumab pegol]; Norco [hydrocodone-acetaminophen]; Statins; and Zantac [ranitidine hcl] Current Outpatient Prescriptions on File Prior to Visit  Medication Sig Dispense Refill  . omeprazole (PRILOSEC) 20 MG capsule Take 1 capsule (20 mg total) by mouth daily. 30 capsule 3   No current facility-administered medications on file prior to visit.     Social History  Substance Use Topics  . Smoking status: Current Every Day Smoker    Packs/day: 0.25    Years: 40.00    Types: Cigarettes  . Smokeless tobacco: Never Used  . Alcohol use Yes     Comment: 3-4 glasses wine/day    Review of Systems  Constitutional: Negative for chills and fever.  Eyes: Negative for visual disturbance.  Respiratory: Negative for cough.  Cardiovascular: Negative for chest pain and palpitations.  Gastrointestinal: Negative for nausea and vomiting.  Musculoskeletal: Positive for neck pain.  Neurological: Negative for headaches.      Objective:    BP (!) 146/88   Pulse (!) 112   Temp (!) 96 F (35.6 C) (Oral)   Ht 5\' 2"  (1.575 m)   Wt 155 lb 3.2 oz (70.4 kg)   SpO2 96%   BMI 28.39 kg/m    Physical Exam  Constitutional: She appears well-developed and well-nourished.  Eyes: Conjunctivae are normal.  Neck: Normal range of motion and full passive range of motion without pain. Neck supple. Spinous process tenderness present. No muscular tenderness present.    Tenderness noted as marked on diagram   Cardiovascular: Normal rate, regular rhythm, normal heart sounds and normal pulses.   Pulmonary/Chest: Effort normal and breath sounds normal. She has no wheezes. She has no rhonchi. She has no rales.  Neurological: She is alert.  Skin: Skin is warm and dry.  Psychiatric: She has a normal mood and affect. Her speech is normal and behavior is normal. Thought content normal.  Vitals reviewed.      Assessment & Plan:   Problem List Items Addressed This Visit      Other   Neck pain - Primary    Symptoms consistent with neck spasm. Patient jointly agreed no imaging was necessary today. Suspect  injury related. And also due to stress. Due to patient's history of low back pain, reasonable to trial gabapentin. Advised patient to stop prednisone immediatelyas jointly agreed likely elevating her heart rate. No chest pain, shortness of breath.      Relevant Medications   gabapentin (NEURONTIN) 100 MG capsule        I have discontinued Ms. Mccully's meloxicam, cyclobenzaprine, and predniSONE. I am also having her start on gabapentin. Additionally, I am having her maintain her omeprazole.   Meds ordered this encounter  Medications  . gabapentin (NEURONTIN) 100 MG capsule    Sig: Take 3 capsules (300 mg total) by mouth at bedtime as needed.    Dispense:  90 capsule    Refill:  3    Order Specific Question:   Supervising Provider    Answer:   Crecencio Mc [[id number]]    Return precautions given.   Risks, benefits, and alternatives of the medications and treatment plan prescribed today were discussed, and patient expressed understanding.   Education regarding symptom management and diagnosis given to patient on AVS.  Continue to follow with Wanda Paris, FNP for routine health maintenance.   Delano L Talbot and I agreed with plan.   Wanda Paris, FNP

## 2016-11-06 NOTE — Progress Notes (Signed)
Pre visit review using our clinic review tool, if applicable. No additional management support is needed unless otherwise documented below in the visit note. 

## 2016-11-06 NOTE — Patient Instructions (Signed)
Trial gabapentin  STOP prednisone today since heartrate elevated  Stop flexeril, mobic  Heat  Let me know if not better

## 2016-11-08 ENCOUNTER — Telehealth: Payer: Self-pay

## 2016-11-08 DIAGNOSIS — M542 Cervicalgia: Secondary | ICD-10-CM

## 2016-11-08 DIAGNOSIS — R252 Cramp and spasm: Secondary | ICD-10-CM

## 2016-11-08 NOTE — Telephone Encounter (Signed)
Patient called and stated that patient has too much inflamation in body.  Patient stated she could not get out of bed.  She is wondering what medication to take since prednisone was off the table and the gabapentin is not working.  Patient would like a call back with an answer.  Patient stated we can leave VM on phone.

## 2016-11-09 ENCOUNTER — Other Ambulatory Visit: Payer: Self-pay | Admitting: Family

## 2016-11-09 MED ORDER — TRAMADOL HCL 50 MG PO TABS: 50.0000 mg | ORAL_TABLET | Freq: Three times a day (TID) | ORAL | 0 refills | Status: DC | PRN

## 2016-11-09 NOTE — Addendum Note (Signed)
Addended by: Burnard Hawthorne on: 11/09/2016 03:53 PM   Modules accepted: Orders

## 2016-11-09 NOTE — Telephone Encounter (Signed)
Pt returned office phone call. Please call her back. °

## 2016-11-09 NOTE — Progress Notes (Unsigned)
Unable to leave message on cell phone due to voicemail box full.  [place] on home phone.   Would she like to try short term dose of  Tramadol? This is related to narcotics which I see she is allergic too.  Or lidocaine patch?  I would like for her to see neurology. Did she end up canceling that appt?   She needs to see neurology or Dr Sharlet Salina again as pain seems to be quickly worsening

## 2016-11-09 NOTE — Telephone Encounter (Signed)
Please advise 

## 2016-11-09 NOTE — Telephone Encounter (Signed)
Call pt  We can try tramadol however she appears to have allergy to narcotics. If she can take, I have placed an order for.   Please advise her to see neurosurgery- pain seems to be worsening.

## 2016-11-09 NOTE — Progress Notes (Signed)
Patient stated she did not want the tramadol at the moment.  Patient wants to Natural remedies first.  Also patient did cancel appointment with Neuro due to not getting an MRI.

## 2016-11-13 NOTE — Telephone Encounter (Addendum)
Dr Lorelei Pont reviewed and advised to send to [facility] office. Advised pt sending note to [facility]. Pt does not want to see rheumatologist. Pt voiced understanding note will go back to PCP.

## 2016-11-13 NOTE — Telephone Encounter (Signed)
Patient Name: Wanda Cervantes  DOB: Jul 06, 1951    Initial Comment Caller states that she had shoulder injection and she thinks she might have them again. She has pain in her body and it has been almost a month now. She cannot lift her arms. She had injection about 3 months ago. Her dog has had stokes and she had to hold him many times and she thinks it is because of it. She had to put both dogs now. She says she is in a lot of pain. her body is swollen, her feet, hands, back pain. She has many things going on right now. She says the pain is not getting better and she is not sure why she has pain in her arms.    Nurse Assessment  Nurse: Genoveva Ill, RN, Lattie Haw Date/Time (Eastern Time): 11/13/2016 3:30:10 PM  Confirm and document reason for call. If symptomatic, describe symptoms. ---caller states has a lot of pain in her arms from shoulders to wrist, feet and hands swollen, upper and lower severe back pain, leg pain and stiff neck; dog had strokes and she had to lift him and care for him for weeks and was under a lot of stress with a lot of strain on her body; cannot hardly left her arms from pain; had shoulder injections 1/24; saw Joycelyn Schmid NP 10 -14 days ago re: neck and back and given meds that are not helping  Does the patient have any new or worsening symptoms? ---Yes  Will a triage be completed? ---Yes  Related visit to physician within the last 2 weeks? ---No  Does the PT have any chronic conditions? (i.e. diabetes, asthma, etc.) ---Yes  List chronic conditions. ---fibromyalgia, scoliosis, RA, psoriatic arthritis  Is this a behavioral health or substance abuse call? ---No     Guidelines    Guideline Title Affirmed Question Affirmed Notes  Shoulder Pain [1] SEVERE pain AND [2] not improved 2 hours after pain medicine    Final Disposition User   Go to ED Now (or PCP triage) Genoveva Ill, RN, Lattie Haw    Comments  CALLER REFUSES ER OR UC AND WANTS TO BE SEEN; Advised note will be placed in chart and someone  will call back  notified Robin at office and states Rina will call pt  Unable to find pt in system; called pt to verify spelling and DOB   Referrals  [facility] REFUSED   Disagree/Comply: Disagree  Disagree/Comply Reason: Disagree with instructions

## 2016-11-14 NOTE — Telephone Encounter (Signed)
Patient would like to see Dr. Sharlet Salina.  Please advise.

## 2016-11-14 NOTE — Telephone Encounter (Signed)
Call pt-  Please circle back with her.   I understand she does not want to see rheum. Would  She see a different rheumatologist? I jjust think that would be helpful for her.                     Other options, I would like her to see either Dr Sharlet Salina again OR an orthopedic. Her pain seems to come very acute and not be well controlled.    She needs to follow with a specialist for optimal control.   Let me know which patient prefers and I will make an appt for her.

## 2016-11-15 NOTE — Telephone Encounter (Signed)
[name],   I know pt is est with chasnis for chronic low back pain but I've struggled a bit to get her to go back. She agrees now  Would you kindly make an appt for her and let her know we have made an appt?   Thanks so much!

## 2016-11-22 ENCOUNTER — Telehealth: Payer: Self-pay | Admitting: *Deleted

## 2016-11-22 NOTE — Telephone Encounter (Signed)
Patient is following up with having injections for both shoulders.  Pt contact [phone]

## 2016-11-23 NOTE — Telephone Encounter (Signed)
Patient requested a call [phone]

## 2016-11-23 NOTE — Telephone Encounter (Signed)
FYI

## 2016-11-23 NOTE — Telephone Encounter (Signed)
[name],  Did we make an appt with chasnis?  Fransisco Beau,  Call pt and ask her to call Dr Sharlet Salina as well. She can make that appt WITHOUT a referral  as she saw him a couple of months ago.  I would advise her to go ahead and call so she is on his schedule.

## 2016-11-24 NOTE — Telephone Encounter (Signed)
She doesn't need referral She saw him in Feb  [name], can we make an appt for her please ???

## 2016-11-24 NOTE — Telephone Encounter (Signed)
Patient needs a referral for Dr. Sharlet Salina office please advise.

## 2016-11-27 ENCOUNTER — Other Ambulatory Visit: Payer: Self-pay | Admitting: Family

## 2016-11-27 DIAGNOSIS — M25512 Pain in left shoulder: Principal | ICD-10-CM

## 2016-11-27 DIAGNOSIS — G8929 Other chronic pain: Secondary | ICD-10-CM

## 2016-11-27 DIAGNOSIS — M25511 Pain in right shoulder: Principal | ICD-10-CM

## 2016-11-27 DIAGNOSIS — M7989 Other specified soft tissue disorders: Secondary | ICD-10-CM

## 2016-11-27 MED ORDER — FUROSEMIDE 20 MG PO TABS: 20.0000 mg | ORAL_TABLET | Freq: Every day | ORAL | 0 refills | Status: DC

## 2016-11-27 NOTE — Telephone Encounter (Signed)
Pt called back stating she does need a referral per Dr Sharlet Salina office before it can be scheduled. Pt says it needs to be for both shoulders to be injected. Referral needed please and thank you!  Call pt @ [phone].

## 2016-11-27 NOTE — Telephone Encounter (Signed)
Patient has been scheduled [id number] @0830  for OV w/ Labs .  Patient stated she understood and had no questions at this time.

## 2016-11-27 NOTE — Progress Notes (Unsigned)
Call pt  2 days of lasix reheck bmp 2 days.   Ref back to chasnis

## 2016-11-27 NOTE — Telephone Encounter (Signed)
Patient called and stated that she was informed by a Chiropractor to call PCP and receive some medication for her feet swelling.  Providers name is Ripley Fraise [phone]. Please advise.

## 2016-11-27 NOTE — Progress Notes (Signed)
Patient has been informed.

## 2016-11-27 NOTE — Telephone Encounter (Signed)
Patient stated that she had swelling in both legs and described having pitting.  Pt also said that she does not have SOB, Warm/redness to indicate DVT.  Elevating legs and compression hose has not helped.

## 2016-11-27 NOTE — Telephone Encounter (Signed)
Call pt  Is the swelling in both legs?   Does she have any SOB, warmth/redness in legs to indicate DVT?   First, limit salt and elevated legs. Also may try compression hose.  Best to treat with lifestyle.   If she needs a diuretic, I would like to examine her first so she would need an OV

## 2016-11-30 ENCOUNTER — Encounter: Payer: Self-pay | Admitting: Family

## 2016-11-30 ENCOUNTER — Ambulatory Visit (INDEPENDENT_AMBULATORY_CARE_PROVIDER_SITE_OTHER): Payer: Medicare HMO | Admitting: Family

## 2016-11-30 VITALS — BP 120/86 | HR 105 | Temp 97.7°F | Ht 62.0 in | Wt 146.8 lb

## 2016-11-30 DIAGNOSIS — K219 Gastro-esophageal reflux disease without esophagitis: Secondary | ICD-10-CM | POA: Diagnosis not present

## 2016-11-30 DIAGNOSIS — M7989 Other specified soft tissue disorders: Secondary | ICD-10-CM

## 2016-11-30 DIAGNOSIS — R8290 Unspecified abnormal findings in urine: Secondary | ICD-10-CM

## 2016-11-30 LAB — URINALYSIS, MICROSCOPIC ONLY

## 2016-11-30 LAB — POCT URINALYSIS DIPSTICK
Glucose, UA: NEGATIVE
Ketones, UA: 15
Leukocytes, UA: NEGATIVE
NITRITE UA: POSITIVE
PH UA: 5.5 (ref 5.0–8.0)
SPEC GRAV UA: 1.025 (ref 1.010–1.025)
Urobilinogen, UA: 0.2 E.U./dL

## 2016-11-30 LAB — BASIC METABOLIC PANEL
BUN: 14 mg/dL (ref 6–23)
CALCIUM: 9.8 mg/dL (ref 8.4–10.5)
CO2: 31 mEq/L (ref 19–32)
Chloride: 101 mEq/L (ref 96–112)
Creatinine, Ser: 0.53 mg/dL (ref 0.40–1.20)
GFR: 123.71 mL/min (ref >60.00)
Glucose, Bld: 100 mg/dL — ABNORMAL HIGH (ref 70–99)
Potassium: 4.9 mEq/L (ref 3.5–5.1)
SODIUM: 139 meq/L (ref 135–145)

## 2016-11-30 MED ORDER — HYDROCHLOROTHIAZIDE 12.5 MG PO CAPS: 12.5000 mg | ORAL_CAPSULE | Freq: Every day | ORAL | 0 refills | Status: DC

## 2016-11-30 NOTE — Progress Notes (Signed)
Pre visit review using our clinic review tool, if applicable. No additional management support is needed unless otherwise documented below in the visit note. 

## 2016-11-30 NOTE — Addendum Note (Signed)
Addended by: Burnard Hawthorne on: 11/30/2016 10:31 AM   Modules accepted: Orders

## 2016-11-30 NOTE — Addendum Note (Signed)
Addended by: Elpidio Galea T on: 11/30/2016 11:11 AM   Modules accepted: Orders

## 2016-11-30 NOTE — Patient Instructions (Signed)
Labs today  Labs on Monday 4/16 in the afternoon - make at check out.  Take new medication HCTZ only until swelling resolves and then may come off of it.   It is a blood pressure medication so be careful if feel dizzy, lightheaded.   We will make an appt for you with Dr   Edilia Bo.

## 2016-11-30 NOTE — Assessment & Plan Note (Addendum)
Onset correlates with prednisone taper for neck pain. Short course of HCTZ, BMP today and again next week. Pending urine studies as well.

## 2016-11-30 NOTE — Assessment & Plan Note (Addendum)
COntrolled on PRN PPI.

## 2016-11-30 NOTE — Addendum Note (Signed)
Addended by: Leeanne Rio on: 11/30/2016 10:53 AM   Modules accepted: Orders

## 2016-11-30 NOTE — Progress Notes (Signed)
Subjective:    Patient ID: Wanda Cervantes, female    DOB: 07-10-1951, 66 y.o.   MRN: [id number]  CC: Wanda Cervantes is a 66 y.o. female who presents today for an acute visit.    HPI: CC: leg swelling x 3 weeks, unchanged. Onset after prednisone taper 3 weeks ago.   Best they look best in morning and worsens through day. Limiting salt.   No calf pain, SOB, cough, orthopnea.   Started on lasix , with no improvement.   Taking aleve, oxycodone ( left over from prior surgery), CBD oil for shoulder pain. Still having shoulder pain and would like to see [facility] again. She has an appt with rheumatologist next week     HISTORY:  Past Medical History:  Diagnosis Date  . Anemia   . Anxiety   . Arthritis   . Arthritis    Dr. Ruthell Rummage  . Fibromyalgia   . GERD (gastroesophageal reflux disease)   . Heart murmur   . Hyperlipidemia   . Osteopenia   . Psoriasis with pustules   . Rheumatoid arthritis [facility])    Past Surgical History:  Procedure Laterality Date  . ABDOMINAL HYSTERECTOMY    . ABDOMINAL HYSTERECTOMY  1980  . CARPAL TUNNEL RELEASE Bilateral   . CARPAL TUNNEL RELEASE Right 01/15/2015   Procedure: CARPAL TUNNEL RELEASE, SYNOVECTOMY;  Surgeon: Christophe Louis, MD;  Location: [facility] ORS;  Service: Orthopedics;  Laterality: Right;  . CARPAL TUNNEL RELEASE  2016   both hands, revision 2016  . CARPAL TUNNEL RELEASE Left 03/19/2015   Procedure: CARPAL TUNNEL RELEASE;  Surgeon: Christophe Louis, MD;  Location: [facility] ORS;  Service: Orthopedics;  Laterality: Left;  . COLONOSCOPY WITH PROPOFOL N/A 06/11/2015   Procedure: COLONOSCOPY WITH PROPOFOL;  Surgeon: Manya Silvas, MD;  Location: [facility] ENDOSCOPY;  Service: Endoscopy;  Laterality: N/A;  . EYE SURGERY     age 74  . EYE SURGERY     both eyes  . ULNAR NERVE TRANSPOSITION Left 03/19/2015   Procedure: ULNAR NERVE DECOMPRESSION/TRANSPOSITION;  Surgeon: Christophe Louis, MD;  Location: [facility] ORS;  Service: Orthopedics;  Laterality:  Left;   Family History  Problem Relation Age of Onset  . Arthritis Mother   . Hyperlipidemia Mother   . Heart disease Mother   . Hypertension Mother   . Sudden death Mother   . Mental retardation Mother   . Breast cancer Mother 27  . Arthritis Father   . Hyperlipidemia Father   . Heart disease Father   . Hypertension Father   . Mental retardation Father   . Breast cancer Sister     1/2 sister-maternal side    Allergies: Aspirin; Cimzia [certolizumab pegol]; Norco [hydrocodone-acetaminophen]; Statins; and Zantac [ranitidine hcl] Current Outpatient Prescriptions on File Prior to Visit  Medication Sig Dispense Refill  . omeprazole (PRILOSEC) 20 MG capsule Take 1 capsule (20 mg total) by mouth daily. 30 capsule 3   No current facility-administered medications on file prior to visit.     Social History  Substance Use Topics  . Smoking status: Current Every Day Smoker    Packs/day: 0.25    Years: 40.00    Types: Cigarettes  . Smokeless tobacco: Never Used  . Alcohol use Yes     Comment: 3-4 glasses wine/day    Review of Systems  Constitutional: Negative for chills and fever.  Respiratory: Negative for cough, shortness of breath and wheezing.   Cardiovascular: Positive for leg swelling. Negative for chest pain and  palpitations.  Gastrointestinal: Negative for nausea and vomiting.  Musculoskeletal: Positive for back pain.      Objective:    BP 120/86   Pulse (!) 105   Temp 97.7 F (36.5 C) (Oral)   Ht 5\' 2"  (1.575 m)   Wt 146 lb 12.8 oz (66.6 kg)   SpO2 96%   BMI 26.85 kg/m  BP Readings from Last 3 Encounters:  11/30/16 120/86  11/06/16 (!) 146/88  09/13/16 140/80     Physical Exam  Constitutional: She appears well-developed and well-nourished.  Eyes: Conjunctivae are normal.  Cardiovascular: Normal rate, regular rhythm, normal heart sounds and normal pulses.    BLE non pitting edema dorsal aspect of foot, palpable cords or masses. No erythema or increased  warmth. No asymmetry in calf size when compared bilaterally LE hair growth symmetric and present. No discoloration of varicosities noted. LE warm and palpable pedal pulses.   Pulmonary/Chest: Effort normal and breath sounds normal. She has no wheezes. She has no rhonchi. She has no rales.  Neurological: She is alert.  Skin: Skin is warm and dry.  Psychiatric: She has a normal mood and affect. Her speech is normal and behavior is normal. Thought content normal.  Vitals reviewed.      Assessment & Plan:    Problem List Items Addressed This Visit      Digestive   GERD (gastroesophageal reflux disease)    COntrolled on PRN PPI.         Other   Leg swelling - Primary    Onset correlates with prednisone taper for neck pain. Short course of HCTZ, BMP today and again next week. Pending urine studies as well.       Relevant Medications   hydrochlorothiazide (MICROZIDE) 12.5 MG capsule   Other Relevant Orders   Basic metabolic panel   Basic metabolic panel   POCT urinalysis dipstick (Completed)   Urine Microscopic Only   CULTURE, URINE COMPREHENSIVE    Other Visit Diagnoses    Abnormal result on screening urine test       Relevant Orders   Urine Culture       I have discontinued Ms. Suliman's gabapentin, traMADol, and furosemide. I am also having her start on hydrochlorothiazide. Additionally, I am having her maintain her omeprazole.   Meds ordered this encounter  Medications  . hydrochlorothiazide (MICROZIDE) 12.5 MG capsule    Sig: Take 1 capsule (12.5 mg total) by mouth daily.    Dispense:  14 capsule    Refill:  0    Order Specific Question:   Supervising Provider    Answer:   Crecencio Mc [[id number]]    Return precautions given.   Risks, benefits, and alternatives of the medications and treatment plan prescribed today were discussed, and patient expressed understanding.   Education regarding symptom management and diagnosis given to patient on AVS.  Continue  to follow with Mable Paris, FNP for routine health maintenance.   Evalene L Sena and I agreed with plan.   Mable Paris, FNP

## 2016-12-01 ENCOUNTER — Telehealth: Payer: Self-pay | Admitting: Family

## 2016-12-01 NOTE — Telephone Encounter (Signed)
Pt called requesting lab results. Please advise, thank you!  Call pt @ [phone]

## 2016-12-03 LAB — URINE CULTURE

## 2016-12-04 ENCOUNTER — Other Ambulatory Visit: Payer: Self-pay | Admitting: Family

## 2016-12-04 ENCOUNTER — Other Ambulatory Visit: Payer: Self-pay

## 2016-12-04 DIAGNOSIS — Z79899 Other long term (current) drug therapy: Secondary | ICD-10-CM | POA: Diagnosis not present

## 2016-12-04 DIAGNOSIS — R918 Other nonspecific abnormal finding of lung field: Secondary | ICD-10-CM | POA: Diagnosis not present

## 2016-12-04 DIAGNOSIS — M8588 Other specified disorders of bone density and structure, other site: Secondary | ICD-10-CM | POA: Diagnosis not present

## 2016-12-04 DIAGNOSIS — M7989 Other specified soft tissue disorders: Secondary | ICD-10-CM | POA: Diagnosis not present

## 2016-12-04 DIAGNOSIS — M19072 Primary osteoarthritis, left ankle and foot: Secondary | ICD-10-CM | POA: Diagnosis not present

## 2016-12-04 DIAGNOSIS — N3000 Acute cystitis without hematuria: Secondary | ICD-10-CM

## 2016-12-04 DIAGNOSIS — Z720 Tobacco use: Secondary | ICD-10-CM | POA: Diagnosis not present

## 2016-12-04 DIAGNOSIS — M659 Synovitis and tenosynovitis, unspecified: Secondary | ICD-10-CM | POA: Diagnosis not present

## 2016-12-04 DIAGNOSIS — M1612 Unilateral primary osteoarthritis, left hip: Secondary | ICD-10-CM | POA: Diagnosis not present

## 2016-12-04 DIAGNOSIS — M0579 Rheumatoid arthritis with rheumatoid factor of multiple sites without organ or systems involvement: Secondary | ICD-10-CM | POA: Diagnosis not present

## 2016-12-04 MED ORDER — AMOXICILLIN-POT CLAVULANATE 875-125 MG PO TABS: 1.0000 | ORAL_TABLET | Freq: Two times a day (BID) | ORAL | 0 refills | Status: AC

## 2016-12-04 NOTE — Telephone Encounter (Signed)
Please see result note 

## 2016-12-04 NOTE — Telephone Encounter (Signed)
Pt called to follow up on the result. Thank you!

## 2016-12-04 NOTE — Progress Notes (Signed)
No show fee removed.

## 2016-12-06 ENCOUNTER — Other Ambulatory Visit: Payer: Self-pay | Admitting: Internal Medicine

## 2016-12-06 ENCOUNTER — Other Ambulatory Visit (INDEPENDENT_AMBULATORY_CARE_PROVIDER_SITE_OTHER): Payer: Medicare HMO

## 2016-12-06 DIAGNOSIS — R918 Other nonspecific abnormal finding of lung field: Secondary | ICD-10-CM

## 2016-12-06 DIAGNOSIS — M7989 Other specified soft tissue disorders: Secondary | ICD-10-CM | POA: Diagnosis not present

## 2016-12-06 DIAGNOSIS — R634 Abnormal weight loss: Secondary | ICD-10-CM

## 2016-12-06 LAB — BASIC METABOLIC PANEL
BUN: 11 mg/dL (ref 6–23)
CO2: 28 mEq/L (ref 19–32)
Calcium: 9.4 mg/dL (ref 8.4–10.5)
Chloride: 97 mEq/L (ref 96–112)
Creatinine, Ser: 0.51 mg/dL (ref 0.40–1.20)
GFR: 129.32 mL/min (ref >60.00)
Glucose, Bld: 91 mg/dL (ref 70–99)
Potassium: 4.1 mEq/L (ref 3.5–5.1)
Sodium: 135 mEq/L (ref 135–145)

## 2016-12-07 ENCOUNTER — Telehealth: Payer: Self-pay

## 2016-12-07 DIAGNOSIS — M7989 Other specified soft tissue disorders: Secondary | ICD-10-CM

## 2016-12-07 MED ORDER — FUROSEMIDE 20 MG PO TABS: 20.0000 mg | ORAL_TABLET | Freq: Every day | ORAL | 0 refills | Status: DC

## 2016-12-07 NOTE — Telephone Encounter (Signed)
Patient advised of below and verbalized an understanding . Lab appointment scheduled.

## 2016-12-07 NOTE — Telephone Encounter (Signed)
Stop hctz.   Lasix is stronger- last time we only did for 3 days and likely NOT enough  Let's do a week of lasix. She will need to come check labs 5 days after starting lasix. Pended labs

## 2016-12-07 NOTE — Addendum Note (Signed)
Addended by: Burnard Hawthorne on: 12/07/2016 03:55 PM   Modules accepted: Orders

## 2016-12-07 NOTE — Telephone Encounter (Signed)
Patient stated she is swelling. She feels the water pil is not enough.  She is not urinating as much as thought. Please advise.

## 2016-12-08 ENCOUNTER — Ambulatory Visit: Payer: Medicare HMO

## 2016-12-13 ENCOUNTER — Ambulatory Visit (INDEPENDENT_AMBULATORY_CARE_PROVIDER_SITE_OTHER): Payer: Medicare HMO | Admitting: Family Medicine

## 2016-12-13 ENCOUNTER — Encounter (INDEPENDENT_AMBULATORY_CARE_PROVIDER_SITE_OTHER): Payer: Self-pay

## 2016-12-13 ENCOUNTER — Encounter: Payer: Self-pay | Admitting: Family Medicine

## 2016-12-13 VITALS — BP 120/78 | HR 105 | Temp 98.0°F | Ht 62.0 in | Wt 144.5 lb

## 2016-12-13 DIAGNOSIS — M05711 Rheumatoid arthritis with rheumatoid factor of right shoulder without organ or systems involvement: Secondary | ICD-10-CM

## 2016-12-13 DIAGNOSIS — M05712 Rheumatoid arthritis with rheumatoid factor of left shoulder without organ or systems involvement: Secondary | ICD-10-CM | POA: Diagnosis not present

## 2016-12-13 MED ORDER — METHYLPREDNISOLONE ACETATE 40 MG/ML IJ SUSP
80.0000 mg | Freq: Once | INTRAMUSCULAR | Status: AC
  Administered 2016-12-13: 80 mg via INTRA_ARTICULAR

## 2016-12-13 NOTE — Addendum Note (Signed)
Addended by: Carter Kitten on: 12/13/2016 09:16 AM   Modules accepted: Orders

## 2016-12-13 NOTE — Progress Notes (Signed)
Pre visit review using our clinic review tool, if applicable. No additional management support is needed unless otherwise documented below in the visit note. 

## 2016-12-13 NOTE — Progress Notes (Signed)
   Dr. Frederico Hamman T. Nili Honda, MD, [facility] [place], [address] Phone: [phone] Fax: [phone]  12/13/2016  Patient: Wanda Cervantes, MRN: [id number], DOB: [birth year], 66 y.o.  Primary Physician:  Mable Paris, FNP   Chief Complaint  Patient presents with  . Shoulder Pain    Bilateral Injections   Subjective:   Wanda Cervantes is a 66 y.o. very pleasant female patient who presents with the following:  Procedure only.  Known diffuse RA on [place]rav[place]. B shoulder injections 09/13/2016. Intervally, she had a dog that had multiple CVA's, requiring lifting 15 times a day and increased pain in her shoulders.   Intrarticular Shoulder Injection, R Verbal consent was obtained from the patient. Risks including infection explained and contrasted with benefits and alternatives. Patient prepped with Chloraprep and Ethyl Chloride used for anesthesia. An intraarticular shoulder injection was performed using the posterior approach. The patient tolerated the procedure well and had decreased pain post injection. No complications. Injection: 8 cc of Marcaine 0.25%  and 2 mL Depo-Medrol 40 mg. Needle: 22 gauge   Intrarticular Shoulder Injection, L Verbal consent was obtained from the patient. Risks including infection explained and contrasted with benefits and alternatives. Patient prepped with Chloraprep and Ethyl Chloride used for anesthesia. An intraarticular shoulder injection was performed using the posterior approach. The patient tolerated the procedure well and had decreased pain post injection. No complications. Injection: 8 cc of Marcaine 0.25%  and 2 mL Depo-Medrol 40 mg. Needle: 22 gauge   Signed,  Talea Manges T. Sadrac Zeoli, MD

## 2016-12-14 ENCOUNTER — Other Ambulatory Visit: Payer: Self-pay

## 2016-12-15 ENCOUNTER — Other Ambulatory Visit (INDEPENDENT_AMBULATORY_CARE_PROVIDER_SITE_OTHER): Payer: Medicare HMO

## 2016-12-15 DIAGNOSIS — M7989 Other specified soft tissue disorders: Secondary | ICD-10-CM | POA: Diagnosis not present

## 2016-12-15 LAB — BASIC METABOLIC PANEL
BUN: 14 mg/dL (ref 6–23)
CHLORIDE: 104 meq/L (ref 96–112)
CO2: 25 meq/L (ref 19–32)
Calcium: 9.4 mg/dL (ref 8.4–10.5)
Creatinine, Ser: 0.51 mg/dL (ref 0.40–1.20)
GFR: 129.31 mL/min (ref >60.00)
Glucose, Bld: 110 mg/dL — ABNORMAL HIGH (ref 70–99)
POTASSIUM: 3.9 meq/L (ref 3.5–5.1)
Sodium: 138 mEq/L (ref 135–145)

## 2016-12-20 ENCOUNTER — Ambulatory Visit
Admission: RE | Admit: 2016-12-20 | Discharge: 2016-12-20 | Disposition: A | Discharge status: Home / Self Care | Payer: Medicare HMO | Source: Ambulatory Visit | Attending: Internal Medicine | Admitting: Internal Medicine

## 2016-12-20 DIAGNOSIS — R938 Abnormal findings on diagnostic imaging of other specified body structures: Secondary | ICD-10-CM | POA: Insufficient documentation

## 2016-12-20 DIAGNOSIS — Z720 Tobacco use: Secondary | ICD-10-CM | POA: Diagnosis present

## 2016-12-20 DIAGNOSIS — R918 Other nonspecific abnormal finding of lung field: Secondary | ICD-10-CM | POA: Insufficient documentation

## 2016-12-20 DIAGNOSIS — R634 Abnormal weight loss: Secondary | ICD-10-CM

## 2016-12-20 MED ORDER — IOPAMIDOL (ISOVUE-300) INJECTION 61%
75.0000 mL | Freq: Once | INTRAVENOUS | Status: AC | PRN
  Administered 2016-12-20: 75 mL via INTRAVENOUS

## 2017-01-16 DIAGNOSIS — Z79899 Other long term (current) drug therapy: Secondary | ICD-10-CM | POA: Diagnosis not present

## 2017-01-16 DIAGNOSIS — M0579 Rheumatoid arthritis with rheumatoid factor of multiple sites without organ or systems involvement: Secondary | ICD-10-CM | POA: Diagnosis not present

## 2017-01-16 DIAGNOSIS — R938 Abnormal findings on diagnostic imaging of other specified body structures: Secondary | ICD-10-CM | POA: Diagnosis not present

## 2017-01-16 DIAGNOSIS — M059 Rheumatoid arthritis with rheumatoid factor, unspecified: Secondary | ICD-10-CM | POA: Diagnosis not present

## 2017-01-16 DIAGNOSIS — M1612 Unilateral primary osteoarthritis, left hip: Secondary | ICD-10-CM | POA: Diagnosis not present

## 2017-01-24 ENCOUNTER — Other Ambulatory Visit: Payer: Self-pay | Admitting: Specialist

## 2017-01-24 DIAGNOSIS — R0602 Shortness of breath: Secondary | ICD-10-CM | POA: Diagnosis not present

## 2017-01-24 DIAGNOSIS — M059 Rheumatoid arthritis with rheumatoid factor, unspecified: Secondary | ICD-10-CM | POA: Diagnosis not present

## 2017-01-24 DIAGNOSIS — Z720 Tobacco use: Secondary | ICD-10-CM | POA: Diagnosis not present

## 2017-01-24 DIAGNOSIS — R938 Abnormal findings on diagnostic imaging of other specified body structures: Secondary | ICD-10-CM | POA: Diagnosis not present

## 2017-02-13 ENCOUNTER — Ambulatory Visit: Payer: Self-pay | Admitting: Family

## 2017-02-26 ENCOUNTER — Telehealth: Payer: Self-pay | Admitting: *Deleted

## 2017-02-26 NOTE — Telephone Encounter (Signed)
Patient stated that her rheumatologist has prescribed prednisone, pt questioned if this medicaio would be okay to take Pt contact [phone]

## 2017-02-27 ENCOUNTER — Encounter: Payer: Self-pay | Admitting: Family

## 2017-02-27 ENCOUNTER — Ambulatory Visit (INDEPENDENT_AMBULATORY_CARE_PROVIDER_SITE_OTHER): Payer: Medicare HMO | Admitting: Family

## 2017-02-27 VITALS — BP 136/80 | HR 98 | Temp 98.0°F | Ht 62.0 in | Wt 143.0 lb

## 2017-02-27 DIAGNOSIS — M799 Soft tissue disorder, unspecified: Secondary | ICD-10-CM

## 2017-02-27 DIAGNOSIS — M7989 Other specified soft tissue disorders: Secondary | ICD-10-CM | POA: Diagnosis not present

## 2017-02-27 NOTE — Assessment & Plan Note (Signed)
Reassured by benign neck exam. Did not appreciate an enlarged thyroid. Offered patient ultrasound -she politely declined at this time. She states she will let me know. Normal TSH 06/2016. No overt symptoms of hyper or hypo-thyroidism. I was able to appreciate patient's xiphoid process and offered reassurance as a normal anatomical finding. Also reviewed CT of chest which did not note any chest mass at xiphoid. Advised to continue to monitor. Also offered patient ultrasound soft tissue of xiphoid today for further evaluation and she politely declined.

## 2017-02-27 NOTE — Telephone Encounter (Signed)
Discussed during OV today 

## 2017-02-27 NOTE — Patient Instructions (Addendum)
reasonable to try prednisone to see if helps with ankle swelling.  If not, let me know if not better and we can consult vascular.   Also, let me know if you would like to pursue ultrasound of thyroid.   We will get thyroid labs today.   You are appreciating your xiphoid process of chest. If you have any pain, let me know and we can pursue imaging. Otherwise, I suspect normal finding.  Follow up 2-3 months.

## 2017-02-27 NOTE — Assessment & Plan Note (Signed)
Working diagnosis of venous insufficiency. Swelling improves overnight with elevation. Also this may be complicated with history of RA . I advised her that it would be okay to trial of prednisone and see if this help with the swelling in her feet. If not, patient I discussed a consult with vascular for additional studies. Patient will let me know if she would like to pursue this.

## 2017-02-27 NOTE — Progress Notes (Signed)
Subjective:    Patient ID: Wanda Cervantes, female    DOB: [birth year], 66 y.o.   MRN: [id number]  CC: Wanda Cervantes is a 66 y.o. female who presents today for follow up.   HPI: Feeling better after dog's death; has new poodle puppy.   Following with rheumatology, now Wanda Cervantes and has helped a lot.   Le swelling- waxing and waning. No swelling in the morning. Worse throughout the day. stopped taking HCTZ, lasix since didn't help. The [place] helped initially however 'seems to be coming back.' Tried compression hose without relief. Follows with low salt diet.   Also notes that feels like area near sternum is bigger, never noticed before, worried about 'hiatal hernia.' Not tender. No trouble swallowing.   Would like thyroid check as friend thought neck seemed 'bigger'. No weight, temperature, or skin changes.      CT chest- has seen [facility] since. No concerns for opacity in right lung.  HISTORY:  Past Medical History:  Diagnosis Date  . Anemia   . Anxiety   . Arthritis   . Arthritis    Dr. Ruthell Rummage  . Fibromyalgia   . GERD (gastroesophageal reflux disease)   . Heart murmur   . Hyperlipidemia   . Osteopenia   . Psoriasis with pustules   . Rheumatoid arthritis [facility])    Past Surgical History:  Procedure Laterality Date  . ABDOMINAL HYSTERECTOMY    . ABDOMINAL HYSTERECTOMY  1980  . CARPAL TUNNEL RELEASE Bilateral   . CARPAL TUNNEL RELEASE Right 01/15/2015   Procedure: CARPAL TUNNEL RELEASE, SYNOVECTOMY;  Surgeon: Christophe Louis, MD;  Location: [facility] ORS;  Service: Orthopedics;  Laterality: Right;  . CARPAL TUNNEL RELEASE  2016   both hands, revision 2016  . CARPAL TUNNEL RELEASE Left 03/19/2015   Procedure: CARPAL TUNNEL RELEASE;  Surgeon: Christophe Louis, MD;  Location: [facility] ORS;  Service: Orthopedics;  Laterality: Left;  . COLONOSCOPY WITH PROPOFOL N/A 06/11/2015   Procedure: COLONOSCOPY WITH PROPOFOL;  Surgeon: Manya Silvas, MD;  Location: [facility] ENDOSCOPY;   Service: Endoscopy;  Laterality: N/A;  . EYE SURGERY     age 42  . EYE SURGERY     both eyes  . ULNAR NERVE TRANSPOSITION Left 03/19/2015   Procedure: ULNAR NERVE DECOMPRESSION/TRANSPOSITION;  Surgeon: Christophe Louis, MD;  Location: [facility] ORS;  Service: Orthopedics;  Laterality: Left;   Family History  Problem Relation Age of Onset  . Arthritis Mother   . Hyperlipidemia Mother   . Heart disease Mother   . Hypertension Mother   . Sudden death Mother   . Mental retardation Mother   . Breast cancer Mother 69  . Arthritis Father   . Hyperlipidemia Father   . Heart disease Father   . Hypertension Father   . Mental retardation Father   . Breast cancer Sister        1/2 sister-maternal side    Allergies: Aspirin; Cimzia [certolizumab pegol]; Norco [hydrocodone-acetaminophen]; Statins; and Zantac [ranitidine hcl] Current Outpatient Prescriptions on File Prior to Visit  Medication Sig Dispense Refill  . leflunomide (ARAVA) 20 MG tablet Take by mouth.    Marland Kitchen omeprazole (PRILOSEC) 20 MG capsule Take 1 capsule (20 mg total) by mouth daily. 30 capsule 3   No current facility-administered medications on file prior to visit.     Social History  Substance Use Topics  . Smoking status: Current Every Day Smoker    Packs/day: 0.25    Years: 40.00    Types:  Cigarettes  . Smokeless tobacco: Never Used  . Alcohol use Yes     Comment: 3-4 glasses wine/day    Review of Systems  Constitutional: Negative for chills and fever.  HENT: Negative for trouble swallowing and voice change.   Respiratory: Negative for cough and shortness of breath.   Cardiovascular: Positive for leg swelling (chronic). Negative for chest pain and palpitations.  Gastrointestinal: Negative for nausea and vomiting.  Endocrine: Negative for cold intolerance and heat intolerance.  Musculoskeletal: Positive for arthralgias (chronic).      Objective:    BP 136/80   Pulse 98   Temp 98 F (36.7 C) (Oral)   Ht 5\' 2"   (1.575 m)   Wt 143 lb (64.9 kg)   SpO2 96%   BMI 26.16 kg/m  BP Readings from Last 3 Encounters:  02/27/17 136/80  12/13/16 120/78  11/30/16 120/86   Wt Readings from Last 3 Encounters:  02/27/17 143 lb (64.9 kg)  12/13/16 144 lb 8 oz (65.5 kg)  11/30/16 146 lb 12.8 oz (66.6 kg)    Physical Exam  Constitutional: She appears well-developed and well-nourished.  Eyes: Conjunctivae are normal.  Neck: No thyroid mass and no thyromegaly present.  Cardiovascular: Normal rate, regular rhythm, normal heart sounds and normal pulses.   No LE edema, palpable cords or masses. No erythema or increased warmth. No asymmetry in calf size when compared bilaterally LE hair growth symmetric and present. No discoloration of varicosities noted. LE warm and palpable pedal pulses.   Pulmonary/Chest: Effort normal and breath sounds normal. She has no wheezes. She has no rhonchi. She has no rales.    Able to appreciate xiphoid process. No discrete mass, swelling, tenderness.   Lymphadenopathy:       Head (right side): No submental, no submandibular, no tonsillar, no preauricular, no posterior auricular and no occipital adenopathy present.       Head (left side): No submental, no submandibular, no tonsillar, no preauricular, no posterior auricular and no occipital adenopathy present.  Neurological: She is alert.  Skin: Skin is warm and dry.  Psychiatric: She has a normal mood and affect. Her speech is normal and behavior is normal. Thought content normal.  Vitals reviewed.      Assessment & Plan:   Problem List Items Addressed This Visit      Other   Leg swelling - Primary    Working diagnosis of venous insufficiency. Swelling improves overnight with elevation. Also this may be complicated with history of RA . I advised her that it would be okay to trial of prednisone and see if this help with the swelling in her feet. If not, patient I discussed a consult with vascular for additional studies.  Patient will let me know if she would like to pursue this.      Soft tissue mass    Reassured by benign neck exam. Did not appreciate an enlarged thyroid. Offered patient ultrasound -she politely declined at this time. She states she will let me know. Normal TSH 06/2016. No overt symptoms of hyper or hypo-thyroidism. I was able to appreciate patient's xiphoid process and offered reassurance as a normal anatomical finding. Also reviewed CT of chest which did not note any chest mass at xiphoid. Advised to continue to monitor. Also offered patient ultrasound soft tissue of xiphoid today for further evaluation and she politely declined.          I have discontinued Ms. Liebig's furosemide and hydrochlorothiazide. I am also having her  maintain her omeprazole and leflunomide.   No orders of the defined types were placed in this encounter.   Return precautions given.   Risks, benefits, and alternatives of the medications and treatment plan prescribed today were discussed, and patient expressed understanding.   Education regarding symptom management and diagnosis given to patient on AVS.  Continue to follow with Burnard Hawthorne, FNP for routine health maintenance.   Wanda Cervantes and I agreed with plan.   Mable Paris, FNP

## 2017-02-27 NOTE — Progress Notes (Signed)
Pre visit review using our clinic review tool, if applicable. No additional management support is needed unless otherwise documented below in the visit note. 

## 2017-03-01 DIAGNOSIS — M79672 Pain in left foot: Secondary | ICD-10-CM | POA: Diagnosis not present

## 2017-03-01 DIAGNOSIS — R600 Localized edema: Secondary | ICD-10-CM | POA: Diagnosis not present

## 2017-03-01 DIAGNOSIS — M79671 Pain in right foot: Secondary | ICD-10-CM | POA: Diagnosis not present

## 2017-03-01 DIAGNOSIS — M069 Rheumatoid arthritis, unspecified: Secondary | ICD-10-CM | POA: Diagnosis not present

## 2017-03-21 DIAGNOSIS — M1612 Unilateral primary osteoarthritis, left hip: Secondary | ICD-10-CM | POA: Diagnosis not present

## 2017-03-23 DIAGNOSIS — M059 Rheumatoid arthritis with rheumatoid factor, unspecified: Secondary | ICD-10-CM | POA: Diagnosis not present

## 2017-03-23 DIAGNOSIS — Z79899 Other long term (current) drug therapy: Secondary | ICD-10-CM | POA: Diagnosis not present

## 2017-04-25 ENCOUNTER — Telehealth: Payer: Self-pay

## 2017-04-25 NOTE — Telephone Encounter (Signed)
Patient is on the list for [employer] 2018 and may be a good candidate for an AWV. Please let me know if/when appt is scheduled.   

## 2017-04-25 NOTE — Telephone Encounter (Signed)
Left pt message asking to call Ebony Hail back directly at [phone] to schedule AWV. Thanks!  *NOTE* Last AWV 03/15/15

## 2017-05-16 NOTE — Telephone Encounter (Signed)
Error

## 2017-05-25 ENCOUNTER — Ambulatory Visit: Payer: Medicare HMO

## 2017-05-28 ENCOUNTER — Telehealth: Payer: Self-pay | Admitting: Family

## 2017-05-28 NOTE — Telephone Encounter (Signed)
I do not advocate for patient to take any medication to boost human growth hormones  It would be up to her discrepancy

## 2017-05-28 NOTE — Telephone Encounter (Signed)
FYI

## 2017-05-28 NOTE — Telephone Encounter (Signed)
Pt called and wanted to know if she can start taking serovital? Please advise, thank you!  Call pt @ [phone]

## 2017-05-28 NOTE — Telephone Encounter (Signed)
Left message for patient to return call back.  

## 2017-05-28 NOTE — Telephone Encounter (Signed)
Pt called back looking for an answer. Please advise, thank you!  Call pt @ [phone]

## 2017-05-28 NOTE — Telephone Encounter (Signed)
Please advise 

## 2017-05-29 NOTE — Telephone Encounter (Signed)
Patient wants to provider to elaborate the meaning behind your statement. Is it bad for her? should she not take it?  "Wants a better answer".  Patient is starting the medication tonight. If provider thinks it is bad for her to let her know.  Ok to leave VM.Patient is not taking any medication what so ever.

## 2017-05-29 NOTE — Telephone Encounter (Signed)
Pt called back returning your call. Please advise, thank you!  Call pt @ [phone]

## 2017-05-29 NOTE — Telephone Encounter (Signed)
Call pt-  I do not advise that medication nor do I know if it is safe.   It is supplement and I do not recommend.   She may use her discretion.

## 2017-05-30 NOTE — Telephone Encounter (Signed)
Left message for patient to return call back.  

## 2017-05-30 NOTE — Telephone Encounter (Signed)
Scheduled 07/05/17 °

## 2017-05-31 NOTE — Telephone Encounter (Signed)
Patient has been informed.

## 2017-06-03 DIAGNOSIS — Z23 Encounter for immunization: Secondary | ICD-10-CM | POA: Diagnosis not present

## 2017-06-08 DIAGNOSIS — M4156 Other secondary scoliosis, lumbar region: Secondary | ICD-10-CM | POA: Diagnosis not present

## 2017-06-08 DIAGNOSIS — M545 Low back pain: Secondary | ICD-10-CM | POA: Diagnosis not present

## 2017-06-08 DIAGNOSIS — M47816 Spondylosis without myelopathy or radiculopathy, lumbar region: Secondary | ICD-10-CM | POA: Diagnosis not present

## 2017-06-12 ENCOUNTER — Telehealth: Payer: Self-pay | Admitting: Family

## 2017-06-12 DIAGNOSIS — Z1239 Encounter for other screening for malignant neoplasm of breast: Secondary | ICD-10-CM

## 2017-06-12 NOTE — Telephone Encounter (Signed)
Please advise 

## 2017-06-12 NOTE — Telephone Encounter (Signed)
Pt called requesting an order for a mammogram. Please advise, thank you!  Call pt @ [phone]

## 2017-06-13 NOTE — Addendum Note (Signed)
Addended by: Burnard Hawthorne on: 06/13/2017 10:58 AM   Modules accepted: Orders

## 2017-06-13 NOTE — Telephone Encounter (Signed)
Ordered  Please advise pt to call and schedule 3d mammogram   We placed a referral for mammogram this year. I asked that you call one the below locations and schedule this when it is convenient for you.   As discussed, I would like you to ask for 3D mammogram over the traditional 2D mammogram as new evidence suggest 3D is superior.   Please note that NOT all insurance companies cover 3D and you may have to pay a higher copay. You may call your insurance company to further clarify your benefits.   Options for [place]  [place], [place]  * [facility] [place] [place] * Note if you ask for 3D mammogram at this location, you must request [place], [place] location*

## 2017-06-28 ENCOUNTER — Ambulatory Visit: Payer: Self-pay | Admitting: Family Medicine

## 2017-07-05 ENCOUNTER — Ambulatory Visit: Payer: Self-pay

## 2017-07-19 DIAGNOSIS — M5416 Radiculopathy, lumbar region: Secondary | ICD-10-CM | POA: Diagnosis not present

## 2017-07-19 DIAGNOSIS — M5136 Other intervertebral disc degeneration, lumbar region: Secondary | ICD-10-CM | POA: Diagnosis not present

## 2017-08-08 ENCOUNTER — Ambulatory Visit
Admission: RE | Admit: 2017-08-08 | Discharge: 2017-08-08 | Disposition: A | Discharge status: Home / Self Care | Payer: Medicare HMO | Source: Ambulatory Visit | Attending: Family | Admitting: Family

## 2017-08-08 DIAGNOSIS — Z1231 Encounter for screening mammogram for malignant neoplasm of breast: Secondary | ICD-10-CM | POA: Diagnosis not present

## 2017-08-08 DIAGNOSIS — Z1239 Encounter for other screening for malignant neoplasm of breast: Secondary | ICD-10-CM

## 2017-08-09 DIAGNOSIS — M059 Rheumatoid arthritis with rheumatoid factor, unspecified: Secondary | ICD-10-CM | POA: Diagnosis not present

## 2017-08-09 DIAGNOSIS — M47816 Spondylosis without myelopathy or radiculopathy, lumbar region: Secondary | ICD-10-CM | POA: Diagnosis not present

## 2017-08-09 DIAGNOSIS — R21 Rash and other nonspecific skin eruption: Secondary | ICD-10-CM | POA: Diagnosis not present

## 2017-08-09 DIAGNOSIS — M1612 Unilateral primary osteoarthritis, left hip: Secondary | ICD-10-CM | POA: Diagnosis not present

## 2017-08-09 DIAGNOSIS — R9389 Abnormal findings on diagnostic imaging of other specified body structures: Secondary | ICD-10-CM | POA: Diagnosis not present

## 2017-08-28 DIAGNOSIS — R69 Illness, unspecified: Secondary | ICD-10-CM | POA: Diagnosis not present

## 2017-09-10 ENCOUNTER — Ambulatory Visit: Payer: Self-pay

## 2017-09-10 NOTE — Telephone Encounter (Signed)
Pt. called to report having intermittent episodes of her "heart fluttering."  Stated "they scare me to death."  Reported the episodes usually occur in the morning, about 2 hours after she has had coffee.  Reported she drinks 2 cups of coffee in the AM.  Stated the last episode was about 1.5 weeks ago.  Reported the episodes last only a few seconds, and go away.  Denied chest pain or dizziness with the episodes, but does have shortness of breath.  Denied having any fluttering at time of this call.  Stated coffee "hypes me up."  Discussed home care measures to possibly reduce the episodes of flutter; ie: decreasing caffeine, smoking, and alcohol.  Pt. admits to smoking, and is trying to cut back.  Encouraged to implement the measures to reduce palpitations, and to call back if no improvement, or worsening of episodes.  Verb. Understanding.  Agrees with plan.        Reason for Disposition . [1] Skipped or extra beat(s) AND [2] occurs < 4 times / minute  Answer Assessment - Initial Assessment Questions 1. DESCRIPTION: "Please describe your heart rate or heart beat that you are having" (e.g., fast/slow, regular/irregular, skipped or extra beats, "palpitations")    "Heart feels normal right now" 2. ONSET: "When did it start?" (Minutes, hours or days)      Almost one year ago  3. DURATION: "How long does it last" (e.g., seconds, minutes, hours)     Only a few seconds  4. PATTERN "Does it come and go, or has it been constant since it started?"  "Does it get worse with exertion?"   "Are you feeling it now?"     Comes and goes  5. TAP: "Using your hand, can you tap out what you are feeling on a chair or table in front of you, so that I can hear?" (Note: not all patients can do this)       Describes a flutter sensation  6. HEART RATE: "Can you tell me your heart rate?" "How many beats in 15 seconds?"  (Note: not all patients can do this)      Pulse 56 at this time. 7. RECURRENT SYMPTOM: "Have you ever had this  before?" If so, ask: "When was the last time?" and "What happened that time?"      Approx. 1.5 weeks ago 8. CAUSE: "What do you think is causing the palpitations?"     Unknown  9. CARDIAC HISTORY: "Do you have any history of heart disease?" (e.g., heart attack, angina, bypass surgery, angioplasty, arrhythmia)      No hx.  10. OTHER SYMPTOMS: "Do you have any other symptoms?" (e.g., dizziness, chest pain, sweating, difficulty breathing)       Denied dizziness, chest pain, or sweating ; stated she will have SOB; "it lasts only seconds?  11. PREGNANCY: "Is there any chance you are pregnant?" "When was your last menstrual period?"       no  Protocols used: HEART RATE AND HEARTBEAT QUESTIONS-A-AH

## 2017-10-19 ENCOUNTER — Telehealth: Payer: Self-pay | Admitting: Family

## 2017-10-19 NOTE — Telephone Encounter (Unsigned)
Copied from [place]. Topic: Quick Communication - Rx Refill/Question >> Oct 19, 2017  5:27 PM Neva Seat wrote: Wanda Cervantes - eye drops - red swollen eyes Antibiotics - 3 week cold not getting better  I told pt she will need to come in for a visit.  She stated that would not work.  She wants Rx's to be called in.

## 2017-10-23 ENCOUNTER — Telehealth: Payer: Self-pay | Admitting: Family

## 2017-10-23 NOTE — Telephone Encounter (Signed)
Hi kathy,  Please advise she needs an appt if she has had a cold for 3 weeks  It is not safe medicine to call in antibiotics over the phone without seeing a patient.  See what office has openings today/ tomorrow

## 2017-10-23 NOTE — Telephone Encounter (Signed)
Mail pt  Wanda Cervantes,   I am back from maternity and getting caught up on everything.   I reviewing your chart, it appears that you are due for CT of the chest for a lung cancer screen.  I know you saw pulmonology last year, Dr Raul Del, regarding a CT chest which Dr. Annalee Genta had ordered. I am unable to tell what the conclusion and if/when you should repeat the CT chest. I just want to ensure the loop has been closed.     It is annual test and if you qualify per below, I would be happy to order for you.     From an insurance perspective, as long as you meet the following criteria, it is paid for since it is preventative care.    Criteria for low dose lung cancer screening scan:  Age between 55-77   Current smoker or former if quit within the last 15 years  History of smoking at least 1 pack a day for 30 years or that equivalent. (2 packs a day for 15 years,  pack a day for 60 years)   If it is a screen that you a still interested in, please call the office or send me a mychart message to let me know so that I can order for you.   Hope you are well - and if it's easier to make a follow up appointment and discuss, would enjoy seeing you!  Best,  Mable Paris, NP

## 2017-10-23 NOTE — Telephone Encounter (Signed)
Called patient and she states she has been using [facility]tt[facility] and has script for Prednisone which she hasn't started. In below message it looked like she was already on antibiotic.   Advised patient she would need appointment she declined to schedule appointment . She states she will go to eye MD.

## 2017-10-23 NOTE — Telephone Encounter (Signed)
Letter printed , and mailed

## 2017-11-13 NOTE — Telephone Encounter (Signed)
Pt states she is not interested in doing this test.  Every since she had the first ct, she has coughed and coughed.  Never had any issues before this. So is declining to ever do again!!

## 2017-11-14 NOTE — Telephone Encounter (Signed)
noted 

## 2017-11-19 ENCOUNTER — Ambulatory Visit: Payer: Self-pay | Admitting: Family Medicine

## 2017-11-29 ENCOUNTER — Telehealth: Payer: Self-pay | Admitting: Family

## 2017-11-29 DIAGNOSIS — M069 Rheumatoid arthritis, unspecified: Secondary | ICD-10-CM

## 2017-11-29 DIAGNOSIS — L400 Psoriasis vulgaris: Secondary | ICD-10-CM | POA: Diagnosis not present

## 2017-11-29 DIAGNOSIS — L659 Nonscarring hair loss, unspecified: Secondary | ICD-10-CM | POA: Diagnosis not present

## 2017-11-29 NOTE — Telephone Encounter (Signed)
Copied from [place] [id number]. Topic: Quick Communication - Rx Refill/Question >> Nov 29, 2017  5:53 PM Waylan Rocher, Lumin L wrote: Medication: omeprazole (PRILOSEC) 20 MG capsule  Has the patient contacted their pharmacy? Yes.   (Agent: If no, request that the patient contact the pharmacy for the refill.) Preferred Pharmacy (with phone number or street name): [facility] [address], [place] - [place] [address] Phone: [phone] Fax: [phone] Agent: Please be advised that RX refills may take up to 3 business days. We ask that you follow-up with your pharmacy.

## 2017-11-29 NOTE — Telephone Encounter (Signed)
LOV: 02/27/17  PCP: Mable Paris, NP  [facility] on [address] in [place]  Pt requesting refill of Omeprazole (Prilosec) 20mg . Last refill was  On 06/29/16 #30 capsules with 3 additional refill.

## 2017-11-30 MED ORDER — OMEPRAZOLE 20 MG PO CPDR: 20.0000 mg | DELAYED_RELEASE_CAPSULE | Freq: Every day | ORAL | 2 refills | Status: DC

## 2017-11-30 NOTE — Telephone Encounter (Signed)
Script sent  

## 2017-12-05 ENCOUNTER — Telehealth: Payer: Self-pay | Admitting: Family

## 2017-12-05 NOTE — Telephone Encounter (Signed)
Copied from [place] [phone]. Topic: Inquiry >> Dec 05, 2017  2:10 PM Corie Chiquito, [place] wrote: Reason for CRM: Patient calling because she received a call to let her know that she should have an AWV done. Patient stated that she would like to speak with someone about this for more information due to the fact that someone comes out to her home and does the kind of the same thing, and she wants to know what is the difference of the two. If someone could give her a call back at [phone] or [phone]

## 2017-12-12 ENCOUNTER — Ambulatory Visit: Payer: Self-pay

## 2017-12-17 NOTE — Telephone Encounter (Signed)
Wanda Cervantes, when you have a free moment, could you please give pt a call? I explained the wellness visit and what all it intels but pt wants to know exactly is asked during the AWV.   Thanks

## 2017-12-21 NOTE — Telephone Encounter (Signed)
Patient was called, no answer.   Left a message with information regarding AWV and phone number for follow up conversation.

## 2017-12-27 ENCOUNTER — Telehealth: Payer: Self-pay | Admitting: Family

## 2017-12-27 NOTE — Telephone Encounter (Signed)
Copied from [place] [id number]. Topic: Quick Communication - See Telephone Encounter >> Dec 27, 2017  4:26 PM Cleaster Corin, NT wrote: CRM for notification. See Telephone encounter for: 12/27/17.  Pt. Would like a call back from health coach about appt. Tomorrow. Pt. Didn't state reason for call just would like to speak with nurse

## 2017-12-28 ENCOUNTER — Ambulatory Visit: Payer: Self-pay

## 2017-12-28 ENCOUNTER — Telehealth: Payer: Self-pay

## 2017-12-28 NOTE — Telephone Encounter (Signed)
No answer when returning calls. Left a voicemail with my direct dial phone number.

## 2017-12-28 NOTE — Telephone Encounter (Signed)
Copied from [place] [phone]. Topic: Quick Communication - Appointment Cancellation >> Dec 28, 2017  7:52 AM Scherrie Gerlach wrote: Patient called to cancel appointment scheduled for. Patient has not rescheduled their appointment. Pt states she is having a RA flair up.  This is the 3rd time she has cancelled.  Route to department's PEC pool.

## 2017-12-28 NOTE — Telephone Encounter (Signed)
noted 

## 2018-02-08 DIAGNOSIS — D692 Other nonthrombocytopenic purpura: Secondary | ICD-10-CM | POA: Diagnosis not present

## 2018-02-08 DIAGNOSIS — L400 Psoriasis vulgaris: Secondary | ICD-10-CM | POA: Diagnosis not present

## 2018-02-14 ENCOUNTER — Telehealth: Payer: Self-pay

## 2018-02-14 NOTE — Telephone Encounter (Signed)
Copied from [place] [phone]. Topic: General - Other >> Feb 14, 2018  4:01 PM Mcneil, Ja-Kwan wrote: Reason for CRM: Pt requests a low dosage Rx for a medication for high cholesterol. Pt states Mable Paris is aware of the requests and she just needs the Rx to be sent to [place], [place] - [place] [phone] (Phone)  [phone] (Fax). Cb# [phone]

## 2018-02-15 DIAGNOSIS — H524 Presbyopia: Secondary | ICD-10-CM | POA: Diagnosis not present

## 2018-02-18 NOTE — Telephone Encounter (Signed)
Call pt  Please confirm - is the crestor 5mg  QD that she needs?. Please refill for 6 months if that is correct  She is due for f/u and labs; please make an appt

## 2018-02-25 NOTE — Telephone Encounter (Signed)
Will discuss at appointment 03/01/18

## 2018-03-01 ENCOUNTER — Ambulatory Visit (INDEPENDENT_AMBULATORY_CARE_PROVIDER_SITE_OTHER): Payer: Medicare HMO | Admitting: Family

## 2018-03-01 ENCOUNTER — Ambulatory Visit (INDEPENDENT_AMBULATORY_CARE_PROVIDER_SITE_OTHER): Payer: Medicare HMO

## 2018-03-01 ENCOUNTER — Encounter: Payer: Self-pay | Admitting: Family

## 2018-03-01 VITALS — BP 122/78 | HR 86 | Temp 98.3°F | Resp 12 | Wt 148.5 lb

## 2018-03-01 VITALS — BP 122/78 | HR 86 | Temp 98.3°F | Resp 12 | Ht 60.5 in | Wt 148.8 lb

## 2018-03-01 DIAGNOSIS — M7989 Other specified soft tissue disorders: Secondary | ICD-10-CM

## 2018-03-01 DIAGNOSIS — E785 Hyperlipidemia, unspecified: Secondary | ICD-10-CM

## 2018-03-01 DIAGNOSIS — Z122 Encounter for screening for malignant neoplasm of respiratory organs: Secondary | ICD-10-CM | POA: Diagnosis not present

## 2018-03-01 DIAGNOSIS — R300 Dysuria: Secondary | ICD-10-CM

## 2018-03-01 DIAGNOSIS — S81812A Laceration without foreign body, left lower leg, initial encounter: Secondary | ICD-10-CM | POA: Diagnosis not present

## 2018-03-01 DIAGNOSIS — M05711 Rheumatoid arthritis with rheumatoid factor of right shoulder without organ or systems involvement: Secondary | ICD-10-CM | POA: Diagnosis not present

## 2018-03-01 DIAGNOSIS — F17200 Nicotine dependence, unspecified, uncomplicated: Secondary | ICD-10-CM

## 2018-03-01 DIAGNOSIS — M05712 Rheumatoid arthritis with rheumatoid factor of left shoulder without organ or systems involvement: Secondary | ICD-10-CM | POA: Diagnosis not present

## 2018-03-01 DIAGNOSIS — Z0000 Encounter for general adult medical examination without abnormal findings: Secondary | ICD-10-CM | POA: Diagnosis not present

## 2018-03-01 DIAGNOSIS — IMO0001 Reserved for inherently not codable concepts without codable children: Secondary | ICD-10-CM

## 2018-03-01 DIAGNOSIS — M799 Soft tissue disorder, unspecified: Secondary | ICD-10-CM | POA: Diagnosis not present

## 2018-03-01 DIAGNOSIS — R69 Illness, unspecified: Secondary | ICD-10-CM | POA: Diagnosis not present

## 2018-03-01 LAB — URINALYSIS, ROUTINE W REFLEX MICROSCOPIC
BILIRUBIN URINE: NEGATIVE
Ketones, ur: 40 — AB
LEUKOCYTES UA: NEGATIVE
NITRITE: POSITIVE — AB
Specific Gravity, Urine: 1.025 (ref 1.000–1.030)
Total Protein, Urine: NEGATIVE
Urine Glucose: NEGATIVE
Urobilinogen, UA: 0.2 (ref 0.0–1.0)
pH: 6 (ref 5.0–8.0)

## 2018-03-01 MED ORDER — NICOTINE 14 MG/24HR TD PT24
14.0000 mg | MEDICATED_PATCH | Freq: Every day | TRANSDERMAL | 5 refills | Status: DC
Start: 2018-03-01 — End: 2018-09-20

## 2018-03-01 MED ORDER — MUPIROCIN CALCIUM 2 % EX CREA: 1.0000 "application " | TOPICAL_CREAM | Freq: Three times a day (TID) | CUTANEOUS | 0 refills | Status: DC

## 2018-03-01 MED ORDER — ROSUVASTATIN CALCIUM 5 MG PO TABS
5.0000 mg | ORAL_TABLET | Freq: Every day | ORAL | 3 refills | Status: DC
Start: 2018-03-01 — End: 2018-03-04

## 2018-03-01 NOTE — Assessment & Plan Note (Signed)
Congratulated patient on effort to quit smoking.  She politely declines any oral medication today.  She would like the patch.  rx given

## 2018-03-01 NOTE — Patient Instructions (Addendum)
Start crestor 5mg  once per week .Marland Kitchen Slowly increase as discussed  CT Chest screening- let us know if [facility] doesn't call you  Start bactraban on left leg wound- please stay vigilant for signs of infection - redness, warmth, discharge  Pending urine studies  Make a fasting lab appointment out front

## 2018-03-01 NOTE — Assessment & Plan Note (Signed)
History of smoking, pending CT scan. Of note, unable to see note from Dr. Raul Del after Dr. Meda Coffee ordered CT chest regarding recommendation.   She was referred there for left opacity of lung.  Pending CT chest for lung cancer screening.

## 2018-03-01 NOTE — Assessment & Plan Note (Signed)
Pending UA, urine culture.  Patient declines empiric antibiotics today and will await urine culture.

## 2018-03-01 NOTE — Progress Notes (Signed)
Subjective:    Patient ID: Wanda Cervantes, female    DOB: 05/26/1951, 67 y.o.   MRN: [id number]  CC: Wanda Cervantes is a 66 y.o. female who presents today for follow up.   HPI: Multiple concerns today  Left lower shin laceration couple days ago when her dogs harness cut across her leg.  No purulent discharge, erythema.  She is been putting Neosporin on it.  No pain or bleeding.  No history of MRSA   Small knot on lateral bilteralt calves, 7 months. Unchanged. No calf pain, swelling. Nontender. Declines [place].   HLD-interested in starting cholesterol medication again.    Desires to stop smoking. Smoking 4 cigarretes per day. Would like a patch.   Dysuria x 3 days ago, unchanged. No fever, flank pain, changes to vaginal discharge, hematuria.   Chronic low back pain- unchanged. Left low buttucks, burning feeling and catching in low back with walking.  Plans to follow-up with Dr. Dwain Cervantes.  Feels comfortable with the Dr. Roland Cervantes, Dr Wanda Cervantes  RA- follows with Dr Wanda Cervantes.     Dr Wanda Cervantes- advised home excercises for low back pain Had also seen Dr Wanda Cervantes for left hip injection x 3 , right hip injections x 1.          DR Wanda Cervantes- 01/2017. Unable to see full report.  Per patient, no further concern regarding CT chest. CT chest 12/2016- Patchy area of ground glass opacity left lobe HISTORY:  Past Medical History:  Diagnosis Date  . Anemia   . Anxiety   . Arthritis   . Arthritis    Dr. Ruthell Cervantes  . Fibromyalgia   . GERD (gastroesophageal reflux disease)   . Heart murmur   . Hyperlipidemia   . Osteopenia   . Psoriasis with pustules   . Rheumatoid arthritis [facility])    Past Surgical History:  Procedure Laterality Date  . ABDOMINAL HYSTERECTOMY    . ABDOMINAL HYSTERECTOMY  1980  . CARPAL TUNNEL RELEASE Bilateral   . CARPAL TUNNEL RELEASE Right 01/15/2015   Procedure: CARPAL TUNNEL RELEASE, SYNOVECTOMY;  Surgeon: Wanda Louis, MD;  Location: [facility] ORS;  Service: Orthopedics;   Laterality: Right;  . CARPAL TUNNEL RELEASE  2016   both hands, revision 2016  . CARPAL TUNNEL RELEASE Left 03/19/2015   Procedure: CARPAL TUNNEL RELEASE;  Surgeon: Wanda Louis, MD;  Location: [facility] ORS;  Service: Orthopedics;  Laterality: Left;  . COLONOSCOPY WITH PROPOFOL N/A 06/11/2015   Procedure: COLONOSCOPY WITH PROPOFOL;  Surgeon: Wanda Silvas, MD;  Location: [facility] ENDOSCOPY;  Service: Endoscopy;  Laterality: N/A;  . EYE SURGERY     age 13  . EYE SURGERY     both eyes  . ULNAR NERVE TRANSPOSITION Left 03/19/2015   Procedure: ULNAR NERVE DECOMPRESSION/TRANSPOSITION;  Surgeon: Wanda Louis, MD;  Location: [facility] ORS;  Service: Orthopedics;  Laterality: Left;   Family History  Problem Relation Age of Onset  . Arthritis Mother   . Hyperlipidemia Mother   . Heart disease Mother   . Hypertension Mother   . Sudden death Mother   . Mental retardation Mother   . Breast cancer Mother 30  . Arthritis Father   . Hyperlipidemia Father   . Heart disease Father   . Hypertension Father   . Mental retardation Father   . Breast cancer Sister        1/2 sister-maternal side    Allergies: Aspirin; Cimzia [certolizumab pegol]; Norco [hydrocodone-acetaminophen]; Statins; and Zantac [ranitidine hcl] Current Outpatient Medications on  File Prior to Visit  Medication Sig Dispense Refill  . omeprazole (PRILOSEC) 20 MG capsule Take 1 capsule (20 mg total) by mouth daily. 30 capsule 2  . leflunomide (ARAVA) 20 MG tablet Take by mouth.     No current facility-administered medications on file prior to visit.     Social History   Tobacco Use  . Smoking status: Current Every Day Smoker    Packs/day: 0.25    Years: 40.00    Pack years: 10.00    Types: Cigarettes  . Smokeless tobacco: Never Used  Substance Use Topics  . Alcohol use: Yes    Comment: 3-4 glasses wine/day  . Drug use: No    Review of Systems  Constitutional: Negative for chills and fever.  Respiratory: Negative for  cough.   Cardiovascular: Negative for chest pain and palpitations.  Gastrointestinal: Negative for nausea and vomiting.  Musculoskeletal: Positive for back pain (chronic).  Skin: Positive for wound.  Neurological: Negative for numbness.      Objective:    BP 122/78 (BP Location: Left Arm, Patient Position: Sitting, Cuff Size: Normal)   Pulse 86   Temp 98.3 F (36.8 C) (Oral)   Resp 12   Wt 148 lb 8 oz (67.4 kg)   SpO2 99%   BMI 28.52 kg/m  BP Readings from Last 3 Encounters:  03/01/18 122/78  03/01/18 122/78  02/27/17 136/80   Wt Readings from Last 3 Encounters:  03/01/18 148 lb 8 oz (67.4 kg)  03/01/18 148 lb 12.8 oz (67.5 kg)  02/27/17 143 lb (64.9 kg)    Physical Exam  Constitutional: She appears well-developed and well-nourished.  Eyes: Conjunctivae are normal.  Cardiovascular: Normal rate, regular rhythm, normal heart sounds and normal pulses.  No LE edema, palpable cords or masses. No erythema or increased warmth. No asymmetry in calf size when compared bilaterally LE hair growth symmetric and present. No discoloration of varicosities noted. LE warm and palpable pedal pulses.   Pulmonary/Chest: Effort normal and breath sounds normal. She has no wheezes. She has no rhonchi. She has no rales.  Musculoskeletal:       Legs: Approximately 1 cm poorly circumscribed mass on the lateral lower extremities as noted on diagram.  No fluctuance.  No erythema, streaking, swelling, tenderness appreciated on exam  Neurological: She is alert.  Skin: Skin is warm and dry.     2 to 3 cm long laceration noted left anterior lower leg as marked on diagram.  No purulent discharge or bleeding.  Wound is well approximated.  No erythema, streaking, swelling.  Psychiatric: She has a normal mood and affect. Her speech is normal and behavior is normal. Thought content normal.  Vitals reviewed.      Assessment & Plan:   Problem List Items Addressed This Visit      Musculoskeletal and  Integument   Rheumatoid arthritis ([place])    Suspect chronic low back pain, hip pain discomfort complicated by h/o  rheumatoid arthritis.  Patient declines any further imaging from office at this time.  She feels comfortable following Dr. block with potential MRI as we discussed today.  Will follow        Other   HLD (hyperlipidemia)    Trial start Crestor once weekly, will increase from there.      Relevant Medications   rosuvastatin (CRESTOR) 5 MG tablet   Other Relevant Orders   Basic metabolic panel   Comprehensive metabolic panel   Smoking    Congratulated patient  on effort to quit smoking.  She politely declines any oral medication today.  She would like the patch.  rx given      Relevant Medications   nicotine (NICODERM CQ - DOSED IN MG/24 HOURS) 14 mg/24hr patch   Soft tissue mass    Symmetrically located nondescript masses appreciated bilateral lower extremities.  Etiology is nonspecific at this time.  Discussed patient my suspicion for  lipoma.  No evidence for infection at this time.  Advised to pursue ultrasound for further information however patient politely declines.      Encounter for screening for malignant neoplasm of respiratory organs   Relevant Orders   CT CHEST LUNG CANCER SCREENING LOW DOSE WO CONTRAST   Laceration of left lower leg - Primary    No obvious infection based on exam today.  We will empirically cover with Bactroban.  Return precautions given.      Relevant Medications   mupirocin cream (BACTROBAN) 2 %   Dysuria    Pending UA, urine culture.  Patient declines empiric antibiotics today and will await urine culture.      Relevant Orders   CULTURE, URINE COMPREHENSIVE   Urinalysis, Routine w reflex microscopic       I am having Wanda Cervantes start on mupirocin cream, nicotine, and rosuvastatin. I am also having her maintain her leflunomide and omeprazole.   Meds ordered this encounter  Medications  . mupirocin cream (BACTROBAN) 2 %     Sig: Apply 1 application topically 3 (three) times daily. Left low leg    Dispense:  15 g    Refill:  0    Order Specific Question:   Supervising Provider    Answer:   Deborra Medina L [[id number]]  . nicotine (NICODERM CQ - DOSED IN MG/24 HOURS) 14 mg/24hr patch    Sig: Place 1 patch (14 mg total) onto the skin daily.    Dispense:  21 patch    Refill:  5    Order Specific Question:   Supervising Provider    Answer:   Deborra Medina L [[id number]]  . rosuvastatin (CRESTOR) 5 MG tablet    Sig: Take 1 tablet (5 mg total) by mouth daily.    Dispense:  90 tablet    Refill:  3    Order Specific Question:   Supervising Provider    Answer:   Crecencio Mc [[id number]]    Return precautions given.   Risks, benefits, and alternatives of the medications and treatment plan prescribed today were discussed, and patient expressed understanding.   Education regarding symptom management and diagnosis given to patient on AVS.  Continue to follow with Burnard Hawthorne, FNP for routine health maintenance.   Shellee L Cervantes and I agreed with plan.   Mable Paris, FNP

## 2018-03-01 NOTE — Progress Notes (Addendum)
Subjective:   Wanda Cervantes is a 67 y.o. female who presents for Medicare Annual (Subsequent) preventive examination.  Review of Systems:  .No ROS.  Medicare Wellness Visit. Additional risk factors are reflected in the social history.       Objective:     Vitals: BP 122/78 (BP Location: Right Arm, Patient Position: Sitting, Cuff Size: Normal)   Pulse 86   Temp 98.3 F (36.8 C) (Oral)   Resp 12   Ht 5' 0.5" (1.537 m)   Wt 148 lb 12.8 oz (67.5 kg)   SpO2 99%   BMI 28.58 kg/m   Body mass index is 28.58 kg/m.  Advanced Directives 03/01/2018 03/23/2015 03/19/2015 01/15/2015  Does Patient Have a Medical Advance Directive? No Yes Yes No  Type of Advance Directive - Living will;Healthcare Power of Attorney Living will;Healthcare Power of Attorney -  Does patient want to make changes to medical advance directive? - - No - Patient declined -  Copy of Marks in Chart? - - No - copy requested -  Would patient like information on creating a medical advance directive? Yes (MAU/Ambulatory/Procedural Areas - Information given) - - No - patient declined information    Tobacco Social History   Tobacco Use  Smoking Status Current Every Day Smoker  . Packs/day: 0.25  . Years: 40.00  . Pack years: 10.00  . Types: Cigarettes  Smokeless Tobacco Never Used     Ready to quit: Not Answered Counseling given: Not Answered   Clinical Intake:  Pre-visit preparation completed: Yes  Pain : No/denies pain     Nutritional Status: BMI 25 -29 Overweight Diabetes: No  How often do you need to have someone help you when you read instructions, pamphlets, or other written materials from your doctor or pharmacy?: 1 - Never  Interpreter Needed?: No     Past Medical History:  Diagnosis Date  . Anemia   . Anxiety   . Arthritis   . Arthritis    Dr. Ruthell Rummage  . Fibromyalgia   . GERD (gastroesophageal reflux disease)   . Heart murmur   . Hyperlipidemia   . Osteopenia    . Psoriasis with pustules   . Rheumatoid arthritis [facility])    Past Surgical History:  Procedure Laterality Date  . ABDOMINAL HYSTERECTOMY    . ABDOMINAL HYSTERECTOMY  1980  . CARPAL TUNNEL RELEASE Bilateral   . CARPAL TUNNEL RELEASE Right 01/15/2015   Procedure: CARPAL TUNNEL RELEASE, SYNOVECTOMY;  Surgeon: Christophe Louis, MD;  Location: [facility] ORS;  Service: Orthopedics;  Laterality: Right;  . CARPAL TUNNEL RELEASE  2016   both hands, revision 2016  . CARPAL TUNNEL RELEASE Left 03/19/2015   Procedure: CARPAL TUNNEL RELEASE;  Surgeon: Christophe Louis, MD;  Location: [facility] ORS;  Service: Orthopedics;  Laterality: Left;  . COLONOSCOPY WITH PROPOFOL N/A 06/11/2015   Procedure: COLONOSCOPY WITH PROPOFOL;  Surgeon: Manya Silvas, MD;  Location: [facility] ENDOSCOPY;  Service: Endoscopy;  Laterality: N/A;  . EYE SURGERY     age 31  . EYE SURGERY     both eyes  . ULNAR NERVE TRANSPOSITION Left 03/19/2015   Procedure: ULNAR NERVE DECOMPRESSION/TRANSPOSITION;  Surgeon: Christophe Louis, MD;  Location: [facility] ORS;  Service: Orthopedics;  Laterality: Left;   Family History  Problem Relation Age of Onset  . Arthritis Mother   . Hyperlipidemia Mother   . Heart disease Mother   . Hypertension Mother   . Sudden death Mother   . Mental retardation  Mother   . Breast cancer Mother 72  . Arthritis Father   . Hyperlipidemia Father   . Heart disease Father   . Hypertension Father   . Mental retardation Father   . Breast cancer Sister        1/2 sister-maternal side   Social History   Socioeconomic History  . Marital status: Married    Spouse name: Not on file  . Number of children: Not on file  . Years of education: Not on file  . Highest education level: Not on file  Occupational History  . Not on file  Social Needs  . Financial resource strain: Not hard at all  . Food insecurity:    Worry: Never true    Inability: Never true  . Transportation needs:    Medical: No    Non-medical: No    Tobacco Use  . Smoking status: Current Every Day Smoker    Packs/day: 0.25    Years: 40.00    Pack years: 10.00    Types: Cigarettes  . Smokeless tobacco: Never Used  Substance and Sexual Activity  . Alcohol use: Yes    Comment: 3-4 glasses wine/day  . Drug use: No  . Sexual activity: Not on file  Lifestyle  . Physical activity:    Days per week: Not on file    Minutes per session: Not on file  . Stress: Not on file  Relationships  . Social connections:    Talks on phone: Not on file    Gets together: Not on file    Attends religious service: Not on file    Active member of club or organization: Not on file    Attends meetings of clubs or organizations: Not on file    Relationship status: Not on file  Other Topics Concern  . Not on file  Social History Narrative   ** Merged History Encounter **       Lives with husband in [place]      Sister is patient of mine       Outpatient Encounter Medications as of 03/01/2018  Medication Sig  . leflunomide (ARAVA) 20 MG tablet Take by mouth.  Marland Kitchen omeprazole (PRILOSEC) 20 MG capsule Take 1 capsule (20 mg total) by mouth daily.   No facility-administered encounter medications on file as of 03/01/2018.     Activities of Daily Living In your present state of health, do you have any difficulty performing the following activities: 03/01/2018  Hearing? N  Vision? N  Difficulty concentrating or making decisions? N  Walking or climbing stairs? Y  Dressing or bathing? N  Doing errands, shopping? N  Preparing Food and eating ? N  Using the Toilet? N  In the past six months, have you accidently leaked urine? N  Do you have problems with loss of bowel control? N  Managing your Medications? N  Managing your Finances? N  Housekeeping or managing your Housekeeping? N  Some recent data might be hidden    Patient Care Team: Burnard Hawthorne, FNP as PCP - General (Family Medicine) Jackolyn Confer, MD (Internal Medicine)     Assessment:   This is a routine wellness examination for Wanda Cervantes.  The goal of the wellness visit is to assist the patient how to close the gaps in care and create a preventative care plan for the patient.   The roster of all physicians providing medical care to patient is listed in the Snapshot section of the chart.  Osteoporosis risk reviewed.    Safety issues reviewed; Smoke and carbon monoxide detectors in the home. No firearms in the home. Wears seatbelts when driving or riding with others. No violence in the home.  They do not have excessive sun exposure.  Discussed the need for sun protection: hats, long sleeves and the use of sunscreen if there is significant sun exposure.  Patient is alert, normal appearance, oriented to person/place/and time. Correctly identified the president of the [place] and recalls of 3/3 words.Performs simple calculations and can read correct time from watch face. Displays appropriate judgement.  No new identified risk were noted.  No failures at ADL's or IADL's.    BMI- discussed the importance of a healthy diet, water intake and the benefits of aerobic exercise. Educational material provided.   24 hour diet recall: Moderate diet  Dental- every 12 months.  Eye- Visual acuity not assessed per patient preference since they have regular follow up with the ophthalmologist.  Wears corrective lenses.  Sleep patterns- Sleeps 7-8 hours at night.    Health maintenance gaps- closed.  Patient Concerns: Burning with urination x2 days. Desire to quit smoking.  Deferred to pcp for immediate follow up.   Exercise Activities and Dietary recommendations Current Exercise Habits: Home exercise routine, Type of exercise: walking, Time (Minutes): 20, Frequency (Times/Week): 7, Weekly Exercise (Minutes/Week): 140, Intensity: Mild  Goals    . Quit Smoking       Fall Risk Fall Risk  03/01/2018 05/16/2016 02/23/2016 03/15/2015  Falls in the past year? No No No No    Depression Screen PHQ 2/9 Scores 03/01/2018 05/16/2016 02/23/2016 03/15/2015  PHQ - 2 Score 0 0 0 2     Cognitive Function MMSE - Mini Mental State Exam 03/01/2018  Orientation to time 5  Orientation to Place 5  Registration 3  Attention/ Calculation 5  Recall 3  Language- name 2 objects 2  Language- repeat 1  Language- follow 3 step command 3  Language- read & follow direction 1  Write a sentence 1  Copy design 1  Total score 30        Immunization History  Administered Date(s) Administered  . Influenza Split 04/22/2012  . Influenza,inj,Quad PF,6+ Mos 04/28/2014, 05/26/2015, 05/16/2016  . Pneumococcal Polysaccharide-23 07/03/2012  . Tdap 07/03/2012   Screening Tests Health Maintenance  Topic Date Due  . INFLUENZA VACCINE  03/21/2018  . COLONOSCOPY  06/20/2018  . PAP SMEAR  02/23/2019  . MAMMOGRAM  08/09/2019  . TETANUS/TDAP  07/03/2022  . Hepatitis C Screening  Completed  . HIV Screening  Completed      Plan:    End of life planning; Advance aging; Advanced directives discussed. Copy of current HCPOA/Living Will requested upon completion.     I have personally reviewed and noted the following in the patient's chart:   . Medical and social history . Use of alcohol, tobacco or illicit drugs  . Current medications and supplements . Functional ability and status . Nutritional status . Physical activity . Advanced directives . List of other physicians . Hospitalizations, surgeries, and ER visits in previous 12 months . Vitals . Screenings to include cognitive, depression, and falls . Referrals and appointments  In addition, I have reviewed and discussed with patient certain preventive protocols, quality metrics, and best practice recommendations. A written personalized care plan for preventive services as well as general preventive health recommendations were provided to patient.     Varney Biles, LPN  1/61/0960  Agree with  plan. Saw patient on  03/01/18. Mable Paris, NP

## 2018-03-01 NOTE — Assessment & Plan Note (Signed)
Symmetrically located nondescript masses appreciated bilateral lower extremities.  Etiology is nonspecific at this time.  Discussed patient my suspicion for  lipoma.  No evidence for infection at this time.  Advised to pursue ultrasound for further information however patient politely declines.

## 2018-03-01 NOTE — Assessment & Plan Note (Signed)
No obvious infection based on exam today.  We will empirically cover with Bactroban.  Return precautions given.

## 2018-03-01 NOTE — Patient Instructions (Addendum)
  Ms. Wanda Cervantes , Thank you for taking time to come for your Medicare Wellness Visit. I appreciate your ongoing commitment to your health goals. Please review the following plan we discussed and let me know if I can assist you in the future.   Follow up as needed.    Bring a copy of your [place] and/or Living Will to be scanned into chart once completed.  Have a great day!  These are the goals we discussed: Goals    . Quit Smoking       This is a list of the screening recommended for you and due dates:  Health Maintenance  Topic Date Due  . Flu Shot  03/21/2018  . Colon Cancer Screening  06/20/2018  . Pap Smear  02/23/2019  . Mammogram  08/09/2019  . Tetanus Vaccine  07/03/2022  .  Hepatitis C: One time screening is recommended by Center for Disease Control  (CDC) for  adults born from [birth year] through 1963.   Completed  . HIV Screening  Completed

## 2018-03-01 NOTE — Assessment & Plan Note (Signed)
Suspect chronic low back pain, hip pain discomfort complicated by h/o  rheumatoid arthritis.  Patient declines any further imaging from office at this time.  She feels comfortable following Dr. block with potential MRI as we discussed today.  Will follow

## 2018-03-01 NOTE — Assessment & Plan Note (Signed)
Trial start Crestor once weekly, will increase from there.

## 2018-03-03 LAB — CULTURE, URINE COMPREHENSIVE
MICRO NUMBER:: 90828198
SPECIMEN QUALITY: ADEQUATE

## 2018-03-04 ENCOUNTER — Telehealth: Payer: Self-pay | Admitting: Family

## 2018-03-04 ENCOUNTER — Telehealth: Payer: Self-pay

## 2018-03-04 DIAGNOSIS — M069 Rheumatoid arthritis, unspecified: Secondary | ICD-10-CM

## 2018-03-04 DIAGNOSIS — E785 Hyperlipidemia, unspecified: Secondary | ICD-10-CM

## 2018-03-04 MED ORDER — AMOXICILLIN-POT CLAVULANATE 500-125 MG PO TABS: 1.0000 | ORAL_TABLET | Freq: Two times a day (BID) | ORAL | 0 refills | Status: AC

## 2018-03-04 MED ORDER — OMEPRAZOLE 20 MG PO CPDR: 20.0000 mg | DELAYED_RELEASE_CAPSULE | Freq: Every day | ORAL | 2 refills | Status: DC

## 2018-03-04 MED ORDER — ROSUVASTATIN CALCIUM 5 MG PO TABS: 5.0000 mg | ORAL_TABLET | Freq: Every day | ORAL | 3 refills | Status: DC

## 2018-03-04 NOTE — Telephone Encounter (Signed)
Patient she needs scripts for omeprazole , crestor,  sent in pharmacy never received.  ( I can send in)  Patient states mupirocin cream too expensive $136. Can something else be called in .  Thanks

## 2018-03-04 NOTE — Telephone Encounter (Signed)
Copied from [place] [id number]. Topic: Quick Communication - See Telephone Encounter >> Mar 04, 2018 11:49 AM Robina Ade, Helene Kelp D wrote: CRM for notification. See Telephone encounter for: 03/04/18. Patient would like to talk to [facility] or her CMA about her prescription that was given to her on her last ov. Please call patient back, thanks.

## 2018-03-04 NOTE — Telephone Encounter (Signed)
Please advise 

## 2018-03-04 NOTE — Telephone Encounter (Signed)
Copied from [place] [id number]. Topic: Quick Communication - See Telephone Encounter >> Mar 04, 2018  4:53 PM Bea Graff, NT wrote: CRM for notification. See Telephone encounter for: 03/04/18. Pt states that her pharmacy did not receive the rxs for nicotine (NICODERM CQ - DOSED IN MG/24 HOURS) 14 mg/24hr patch, omeprazole (PRILOSEC) 20 MG capsule, and she is unable to afford the Bactroban cream. Please advise.

## 2018-03-04 NOTE — Telephone Encounter (Signed)
Script sent per lab results

## 2018-03-05 ENCOUNTER — Telehealth: Payer: Self-pay | Admitting: *Deleted

## 2018-03-05 NOTE — Telephone Encounter (Signed)
Received referral for low dose lung cancer screening CT scan. Message left at phone number listed in EMR for patient to call me back to facilitate scheduling scan.  

## 2018-03-07 NOTE — Telephone Encounter (Signed)
She could use neosporin. Additionally she is also on an oral antibiotic that should cover provide coverage.

## 2018-03-08 NOTE — Telephone Encounter (Signed)
Left message for patient to return call to office PEC may advise.

## 2018-03-12 NOTE — Telephone Encounter (Signed)
Patient still has not received anything to quit smoking.

## 2018-03-12 NOTE — Telephone Encounter (Signed)
Spoke with [facility] they have filled script . Script was put back due to it wasn't picked up within 9 days . She just needs to call to pick up script. Left message for patient to call to advise.

## 2018-03-12 NOTE — Telephone Encounter (Signed)
rx request 

## 2018-03-15 NOTE — Telephone Encounter (Signed)
Left voice mail for patient to call back ok for PEC to speak to patient , to see if patient has picked up medication

## 2018-03-20 NOTE — Telephone Encounter (Signed)
Followed up with patient she did receive medications.

## 2018-04-19 ENCOUNTER — Telehealth: Payer: Self-pay

## 2018-04-19 NOTE — Telephone Encounter (Signed)
Copied from [place] [phone] [phone]. Topic: General - Other >> Apr 19, 2018  3:12 PM Yvette Rack wrote: Reason for CRM: pt calling wanting a pneumonia shot she cant remember the last time she had one please call her at [phone]

## 2018-04-23 NOTE — Telephone Encounter (Signed)
Patient has pneumovax 23-11/13/03

## 2018-04-24 ENCOUNTER — Ambulatory Visit: Payer: Medicare HMO

## 2018-04-24 NOTE — Telephone Encounter (Signed)
Call pt and let her know this  She will be due for prevar when she is 19 She will need the pneumovax 23 a year after that.

## 2018-04-25 NOTE — Telephone Encounter (Signed)
Left voice mail for patient to call back ok for PEC to speak to patient , please advise of below.

## 2018-04-26 ENCOUNTER — Telehealth: Payer: Self-pay | Admitting: *Deleted

## 2018-04-26 NOTE — Telephone Encounter (Signed)
Spoke at length with patient about referral to LDCT screening program.  Patient reports lots of frustration with physicians and situations over the last 2 years and is unsure if she wants to do this.   Patient wants to think about this over the weekend and would like a call back next Tuesday.  Will call her back at her request

## 2018-04-28 DIAGNOSIS — R69 Illness, unspecified: Secondary | ICD-10-CM | POA: Diagnosis not present

## 2018-04-29 ENCOUNTER — Telehealth: Payer: Self-pay

## 2018-04-29 NOTE — Telephone Encounter (Signed)
Copied from [place] [id number]. Topic: Appointment Scheduling - Scheduling Inquiry for Clinic >> Apr 29, 2018 11:14 AM Bea Graff, NT wrote: Reason for CRM: Patient calling to see if she is due for her pneumonia shot as well as the immunization for whooping cough. Please call pt to schedule if due.

## 2018-04-30 ENCOUNTER — Telehealth: Payer: Self-pay | Admitting: *Deleted

## 2018-04-30 ENCOUNTER — Encounter: Payer: Self-pay | Admitting: *Deleted

## 2018-04-30 DIAGNOSIS — Z122 Encounter for screening for malignant neoplasm of respiratory organs: Secondary | ICD-10-CM

## 2018-04-30 NOTE — Telephone Encounter (Signed)
Patient calling to check the status of this. Would like to know if she is needing the pneumonia or whooping cough vaccine.  Would also like to know why there was a CT scan ordered for her lungs?  Please advise.

## 2018-04-30 NOTE — Telephone Encounter (Signed)
Please advise 

## 2018-04-30 NOTE — Telephone Encounter (Signed)
Received a referral for initial lung cancer screening scan.  Contacted the patient and obtained their smoking history, current smoker with 32.25 pkyr history  as well as answering questions related to screening process.  Patient denies signs of lung cancer such as weight loss or hemoptysis at this time.  Patient denies comorbidity that would prevent curative treatment if lung cancer were found.  Patient is scheduled for the Shared Decision Making Visit and CT scan on 05-31-18@9am  per patients request. .

## 2018-04-30 NOTE — Telephone Encounter (Signed)
Called patient back and advised of date of Tdap vaccine. Looks like in her chart she had Pneum 23 07/03/12 .  She also inquired on why CT Lung screening was being done.  I explained to her why you had ordered. Patient verbalized understanding.

## 2018-04-30 NOTE — Telephone Encounter (Signed)
Received referral for low dose lung cancer screening CT scan.  Message left at phone number listed in EMR for patient to call either myself or Shawn Perkins back at [phone] to facilitate scheduling the scan.    

## 2018-05-01 ENCOUNTER — Telehealth: Payer: Self-pay | Admitting: Family

## 2018-05-01 DIAGNOSIS — Z1239 Encounter for other screening for malignant neoplasm of breast: Secondary | ICD-10-CM

## 2018-05-01 NOTE — Telephone Encounter (Signed)
noted 

## 2018-05-01 NOTE — Telephone Encounter (Signed)
Please advise for rx and vaccination

## 2018-05-01 NOTE — Telephone Encounter (Signed)
Copied from [place] [id number]. Topic: Quick Communication - See Telephone Encounter >> May 01, 2018  5:37 PM Neva Seat wrote: Pt has called back for the 2nd time after waiting 2 days for an answer on: Wanting to know when she is due for her pneumonia shot and her immunization for whooping cough.   Pt asked several months ago about what medication she can take to help her stop smoking. Pt wants a call back on [place] regarding the questions above.

## 2018-05-02 NOTE — Telephone Encounter (Signed)
Patient called back

## 2018-05-02 NOTE — Telephone Encounter (Signed)
Patient again advised of immunizations dates for Tdap and pneumovax. She wants to go ahead and schedule to come in for Prevnar in 07/2018.  Also she wants you to go ahead and order mammogram she is due in 07/2018.   Patient also inquired about nicoderm patches . I checked with pharmacy and there OTC so patient will have to purchase, insurance will not cover.

## 2018-05-02 NOTE — Telephone Encounter (Signed)
Patient returned call

## 2018-05-02 NOTE — Telephone Encounter (Signed)
Pt called back in upset because she still has not received a call back. Pt says that she has requested several calls back, I did remind pt that she spoke with the office on 04/30/18, pt says that she is waiting for a call back from that call. Pt says that she would like to be advised about the whooping cough. When possible pt is requesting a urgent call back.

## 2018-05-02 NOTE — Telephone Encounter (Signed)
Pt called and asked to speak w/ Kristen; contact pt when posssible

## 2018-05-02 NOTE — Telephone Encounter (Signed)
Left voicemail message for patient to call. I spoke with patient on 04/30/18 in regards to vaccinations. Will discuss medications in regards to stop smoking.

## 2018-05-03 NOTE — Telephone Encounter (Signed)
Patient advised mammogram scheduled , she will call back to schedule for prevnar 13 when she turns 67

## 2018-05-03 NOTE — Telephone Encounter (Signed)
Call pt I ordered mammogram  Please advise her to schedule

## 2018-05-30 ENCOUNTER — Telehealth: Payer: Self-pay | Admitting: *Deleted

## 2018-05-30 NOTE — Telephone Encounter (Signed)
Received referral for low dose lung cancer screening CT scan.  Message left at phone number listed in EMR for patient to call either myself or Shawn Perkins back at [phone] to facilitate scheduling the scan.    

## 2018-05-31 ENCOUNTER — Ambulatory Visit (INDEPENDENT_AMBULATORY_CARE_PROVIDER_SITE_OTHER): Payer: Medicare HMO | Admitting: Family Medicine

## 2018-05-31 ENCOUNTER — Ambulatory Visit (INDEPENDENT_AMBULATORY_CARE_PROVIDER_SITE_OTHER)
Admission: RE | Admit: 2018-05-31 | Discharge: 2018-05-31 | Disposition: A | Discharge status: Home / Self Care | Payer: Medicare HMO | Source: Ambulatory Visit | Attending: Family Medicine | Admitting: Family Medicine

## 2018-05-31 ENCOUNTER — Encounter: Payer: Self-pay | Admitting: Family Medicine

## 2018-05-31 ENCOUNTER — Inpatient Hospital Stay: Payer: Medicare HMO | Admitting: Nurse Practitioner

## 2018-05-31 ENCOUNTER — Telehealth: Payer: Self-pay | Admitting: *Deleted

## 2018-05-31 ENCOUNTER — Ambulatory Visit: Payer: Medicare HMO

## 2018-05-31 VITALS — BP 132/80 | HR 81 | Temp 98.3°F | Ht 61.0 in | Wt 152.4 lb

## 2018-05-31 DIAGNOSIS — M545 Low back pain, unspecified: Secondary | ICD-10-CM

## 2018-05-31 DIAGNOSIS — W19XXXA Unspecified fall, initial encounter: Secondary | ICD-10-CM

## 2018-05-31 DIAGNOSIS — S3993XA Unspecified injury of pelvis, initial encounter: Secondary | ICD-10-CM | POA: Diagnosis not present

## 2018-05-31 DIAGNOSIS — S3992XA Unspecified injury of lower back, initial encounter: Secondary | ICD-10-CM | POA: Diagnosis not present

## 2018-05-31 MED ORDER — METHYLPREDNISOLONE ACETATE 40 MG/ML IJ SUSP
40.0000 mg | Freq: Once | INTRAMUSCULAR | Status: AC
  Administered 2018-05-31: 40 mg via INTRAMUSCULAR

## 2018-05-31 MED ORDER — GABAPENTIN 300 MG PO CAPS: 300.0000 mg | ORAL_CAPSULE | Freq: Two times a day (BID) | ORAL | 2 refills | Status: DC

## 2018-05-31 MED ORDER — KETOROLAC TROMETHAMINE 60 MG/2ML IM SOLN
60.0000 mg | Freq: Once | INTRAMUSCULAR | Status: AC
  Administered 2018-05-31: 60 mg via INTRAMUSCULAR

## 2018-05-31 NOTE — Patient Instructions (Signed)

## 2018-05-31 NOTE — Progress Notes (Signed)
Subjective:    Patient ID: Wanda Cervantes, female    DOB: [birth year], 67 y.o.   MRN: [id number]  HPI  Presents to clinic c/o low back pain after falling and landing on buttocks.  Patient states she was going to take the garbage out, dog ran out behind her causing her to fall backward landing against the wall and then sliding onto the floor landing on buttocks.  States this happened 2 weeks ago.  She recently finished a steroid taper, she took this not related to her back pain but related to her rheumatoid arthritis flareup.   Even after finishing steroid taper, pain persists.  States that she notices pain mostly when standing and having to walk longer distances.  States she took an Aleve yesterday but this did not help much.  Patient is concerned she may have a fracture or slipped disc.  Denies any saddle anesthesia.  Denies any loss of bowel or bladder control.  Patient Active Problem List   Diagnosis Date Noted  . Encounter for screening for malignant neoplasm of respiratory organs 03/01/2018  . Laceration of left lower leg 03/01/2018  . Dysuria 03/01/2018  . Soft tissue mass 02/27/2017  . GERD (gastroesophageal reflux disease) 11/30/2016  . Leg swelling 11/30/2016  . Neck pain 11/06/2016  . Bruising 06/29/2016  . Palpitations 05/16/2016  . Rib pain on right side 05/16/2016  . Encounter to establish care 02/23/2016  . History of colonic polyps 11/19/2015  . 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 Diverticulitis of colon 04/01/2015  . Muscle cramp 01/28/2014  . Rheumatoid arthritis ([place]) 05/10/2012  . HLD (hyperlipidemia) 05/10/2012  . Smoking 05/10/2012   Social History   Tobacco Use  . Smoking status: Current Every Day Smoker    Packs/day: 0.75    Years: 43.00    Pack years: 32.25    Types: Cigarettes  . Smokeless tobacco: Never Used  Substance Use Topics  . Alcohol use: Yes    Comment: 3-4 glasses wine/day   Review of Systems   Constitutional: Negative for chills,  fatigue and fever.  HENT: Negative for congestion, ear pain, sinus pain and sore throat.   Eyes: Negative.   Respiratory: Negative for cough, shortness of breath and wheezing.   Cardiovascular: Negative for chest pain, palpitations and leg swelling.  Gastrointestinal: Negative for abdominal pain, diarrhea, nausea and vomiting.  Genitourinary: Negative for dysuria, frequency and urgency.  Musculoskeletal: Negative for arthralgias and myalgias.  Skin: Negative for color change, pallor and rash.  Neurological: Negative for syncope, light-headedness and headaches.  Psychiatric/Behavioral: The patient is not nervous/anxious.       Objective:   Physical Exam  Constitutional: She is oriented to person, place, and time. She appears well-developed. No distress.  HENT:  Head: Normocephalic and atraumatic.  Eyes: EOM are normal. No scleral icterus.  Neck: Normal range of motion. Neck supple. No tracheal deviation present.  Cardiovascular: Normal rate and regular rhythm.  Pulmonary/Chest: Effort normal and breath sounds normal. No respiratory distress.  Musculoskeletal: She exhibits tenderness. She exhibits no edema or deformity.       Back:  Feels pain, pressure, pulling on low back with standing, walking, bilat leg raises in area indicated by red circle on diagram. Grips normal. Quadriceps strength equal.   Neurological: She is alert and oriented to person, place, and time.  Nursing note and vitals reviewed.     Vitals:   05/31/18 0805  BP: 132/80  Pulse: 81  Temp: 98.3 F (36.8 C)  SpO2: 98%   Assessment & Plan:   Acute back pain/fall -- We will get X-rays of lumbar spine and sacrum/coccyx. She will get IM Toradol 60 mg and IM steroid in clinic today. She will start taking gabapentin 300 mg BID, she has used this medication in the past for back pain with success. Back stretching exercise handout given to patient.   Administrations This Visit    ketorolac (TORADOL) injection 60 mg      Admin Date 05/31/2018 Action Given Dose 60 mg Route Intramuscular Administered By Neta Ehlers, RMA       methylPREDNISolone acetate (DEPO-MEDROL) injection 40 mg    Admin Date 05/31/2018 Action Given Dose 40 mg Route Intramuscular Administered By Neta Ehlers, RMA          If pain worsens, you have numbness, you lose bowel or bladder control - go to ER right away.

## 2018-05-31 NOTE — Telephone Encounter (Signed)
Received referral for low dose lung cancer screening CT scan.  Message left at phone number listed in EMR for patient to call either myself or Shawn Perkins back at [phone] to facilitate scheduling the scan.    

## 2018-06-04 ENCOUNTER — Telehealth: Payer: Self-pay | Admitting: *Deleted

## 2018-06-04 ENCOUNTER — Encounter: Payer: Self-pay | Admitting: *Deleted

## 2018-06-04 NOTE — Telephone Encounter (Signed)
Received referral for low dose lung cancer screening CT scan.  Message left at phone number listed in EMR for patient to call either myself or Shawn Perkins back at [phone] to facilitate scheduling the scan.    

## 2018-06-07 ENCOUNTER — Telehealth: Payer: Self-pay | Admitting: Family

## 2018-06-07 DIAGNOSIS — M545 Low back pain, unspecified: Secondary | ICD-10-CM

## 2018-06-07 DIAGNOSIS — G8911 Acute pain due to trauma: Secondary | ICD-10-CM

## 2018-06-07 NOTE — Telephone Encounter (Signed)
Copied from [place] [id number]. Topic: Quick Communication - See Telephone Encounter >> Jun 07, 2018  3:46 PM Bea Graff, NT wrote: CRM for notification. See Telephone encounter for: 06/07/18. Pt states she would like a call back to set up a MRI. She states she is still in pain from her left hip, left leg, all the way down to her ankle. Please advise.

## 2018-06-10 ENCOUNTER — Ambulatory Visit: Payer: Self-pay | Admitting: *Deleted

## 2018-06-10 NOTE — Telephone Encounter (Signed)
Patient called and said she Has been taking gabapentin and it is not helping her pain and she would like to know what she can take that will help her . The pain is very intense she is taking aleve with the gabapentin and it is still not helping , The pain is a really intense ache and burn on her lower back , left hip and down he leg. Please call her at [phone]

## 2018-06-10 NOTE — Telephone Encounter (Signed)
Sending again your way Lauren  :)

## 2018-06-10 NOTE — Telephone Encounter (Signed)
MRI low back ordered

## 2018-06-10 NOTE — Telephone Encounter (Signed)
Pt returning call stating that she is still experiencing pain in lower back that radiates to hip and foot due to a fall a month ago. Pt describes the pain as a burning/aching sensation. Pt states she is currently taking Gabapentin that does not help with the pain, only makes her sleepy. Pt states she also received a shot on both sides for the pain and has been taking aleve with no relief. Pt was seen for appt with Lauren,NP 05/31/18 and was told if she did not have relief of symptoms she could have a MRI done. Pt calling to see when MRI would be completed.  MRI order noted in chart. Called office and spoke with Fransisco Beau who states that referral coordinator would contact the pt to set up appt to have MRI done. Pt notified that she would be contacted by the referral coordinator to set the appt up for the MRI. Pt asking what can she do while waiting for MRI and if she can have pain medication ordered. Pt offered to make appt to discuss treatment options but the pt declines at this time. Pt states that she can not come in to the office due to not being able to walk and having to wait for her husband to be off work so that he can take her to her appts. Notified pt that symptoms would be forwarded to provider to review with possible recommendations. Pt verbalized understanding.   Reason for Disposition . [1] Follow-up call from patient regarding patient's clinical status AND [2] information NON-URGENT  Answer Assessment - Initial Assessment Questions 1. REASON FOR CALL or QUESTION: "What is your reason for calling today?" or "How can I best help you?" or "What question do you have that I can help answer?"     Pt asking about having a MRI due to back pain 2. CALLER: Document the source of call. (e.g., laboratory, patient).     patient  Protocols used: PCP CALL - NO TRIAGE-A-AH

## 2018-06-11 NOTE — Telephone Encounter (Signed)
Can patient really not walk at all or is it painful/uncomfortable to walk? If she cannot walk this is very alarming and she should go to ER.  Please let me know what she says. If it is just painful to walk/uncomfortable we can try increasing gabapentin to 600 mg 2 or 3 times per day

## 2018-06-11 NOTE — Telephone Encounter (Signed)
Pt returning call and spoke with [facility] agent Angie. Pt states she is able to walk from her bedroom to the living room using her walker but is having a burning type of pain in her right leg and foot. Pt states she is currently taking gabapentin and it is not working and does not want to increase the dose.

## 2018-06-11 NOTE — Telephone Encounter (Signed)
Called Pt no answer, Okay for [facility] to talk to PT.

## 2018-06-11 NOTE — Telephone Encounter (Signed)
MRI was ordered yesterday (see other telephone encounter).

## 2018-06-12 MED ORDER — PREDNISONE 10 MG (21) PO TBPK
ORAL_TABLET | ORAL | 0 refills | Status: DC
Start: 2018-06-12 — End: 2018-09-20

## 2018-06-12 MED ORDER — DICLOFENAC SODIUM 75 MG PO TBEC: 75.0000 mg | DELAYED_RELEASE_TABLET | Freq: Two times a day (BID) | ORAL | 0 refills | Status: DC

## 2018-06-12 NOTE — Telephone Encounter (Signed)
Pt scheduled for MRI on 11/8 and is aware of her appointment.  She states that she received a call stating that you are wanting to rx her more meds that she already has and that she has been taking them. She says that they are not working for her pain. She wants something that will help her pain but will not make her drowsy. Also, she is using a walker to help her get around.  Pt cb [phone]

## 2018-06-12 NOTE — Telephone Encounter (Signed)
I can do another steroid taper & we can try diclofenac BID.   Most other pain meds can cause drowsiness.

## 2018-06-13 ENCOUNTER — Telehealth: Payer: Self-pay | Admitting: Family

## 2018-06-13 MED ORDER — TRAMADOL HCL 50 MG PO TABS: 50.0000 mg | ORAL_TABLET | Freq: Four times a day (QID) | ORAL | 0 refills | Status: DC | PRN

## 2018-06-13 NOTE — Telephone Encounter (Signed)
I can do a few days of tramadol for worse pain, but I really think she should try the diclofenac and see if is makes any difference as well as the steroid taper

## 2018-06-13 NOTE — Telephone Encounter (Signed)
Patient notified and voiced understanding.

## 2018-06-13 NOTE — Telephone Encounter (Signed)
Patient called and wanted to know why her MRI was of the lower back and not of the hip explained to patient that her hip X-rayed and showed no damage that sometimes pain as described by patient " my pain is a 10 it runs from hip down my leg on left side, described as burning searing pain with a very dull ache"", patient states the gabapentin makes her nauseated and does nothing for her pain but makes her sleepy. Advised patient that a prednisone taper has been called in with Voltaren to help with pain and inflammation patient stated she will pick these medications up, and give this a try, but was wondering why she cannot get a mild pain medication.

## 2018-06-17 ENCOUNTER — Other Ambulatory Visit: Payer: Self-pay | Admitting: Family

## 2018-06-24 ENCOUNTER — Telehealth: Payer: Self-pay

## 2018-06-24 DIAGNOSIS — M545 Low back pain, unspecified: Secondary | ICD-10-CM

## 2018-06-24 MED ORDER — TRAMADOL HCL 50 MG PO TABS: 50.0000 mg | ORAL_TABLET | Freq: Two times a day (BID) | ORAL | 0 refills | Status: DC | PRN

## 2018-06-24 NOTE — Telephone Encounter (Signed)
Call pt  I will give patient a month supply tramadol however she will need to establish with pain management, as pain seems severe.   Or she can see Dr Sharlet Salina or Dr Roland Rack again.   Let me know what she would like to do.   I will only prescribe tramadol BID PRN for severe pain as tramadol can put patients at risk for seizures. This is NOT a daily scheduled medication.    Tramadol is also an opioid like medication so there are risks of overdose, addiction. No alcohol on this medication.   Is diclofenac helping?  I looked up patient on [place] Controlled Substances Reporting System and saw no activity that raised concern of inappropriate use.

## 2018-06-24 NOTE — Telephone Encounter (Signed)
Copied from [place] [phone]. Topic: Quick Communication - Rx Refill/Question >> Jun 24, 2018  1:31 PM Carolyn Stare wrote: Medication traMADol (ULTRAM) 50 MG tablet  Pt would like a call back when this has been called in   Preferred [facility]   Agent: Please be advised that RX refills may take up to 3 business days. We ask that you follow-up with your pharmacy.

## 2018-06-24 NOTE — Addendum Note (Signed)
Addended by: Burnard Hawthorne on: 06/24/2018 04:59 PM   Modules accepted: Orders

## 2018-06-25 NOTE — Telephone Encounter (Signed)
Spoke with patient she states she doesn't take Tramadol on scheduled basis. She is scheduled for MRI on 06/28/18.  She is taking diclofenac and it seems to be helping. She would like to see on results of MRI before going back to Dr Sharlet Salina or Dr Roland Rack.

## 2018-06-25 NOTE — Addendum Note (Signed)
Addended by: Johna Sheriff on: 06/25/2018 09:36 AM   Modules accepted: Orders

## 2018-06-25 NOTE — Telephone Encounter (Addendum)
Please transfer to office thanks

## 2018-06-26 MED ORDER — DICLOFENAC SODIUM 75 MG PO TBEC: 75.0000 mg | DELAYED_RELEASE_TABLET | Freq: Every day | ORAL | 1 refills | Status: DC | PRN

## 2018-06-26 NOTE — Telephone Encounter (Signed)
Call patient  I refilled her diclofenac.  How often is taking this medication?  It is a good medication however since it is a potent NSAID,advised to take with food, and also advised that she is on this medication is really meant for short duration, and not daily  Please ensure she has a f/u with me in the next 3-4 months.

## 2018-06-26 NOTE — Addendum Note (Signed)
Addended by: Burnard Hawthorne on: 06/26/2018 09:11 AM   Modules accepted: Orders

## 2018-06-28 ENCOUNTER — Ambulatory Visit
Admission: RE | Admit: 2018-06-28 | Discharge: 2018-06-28 | Disposition: A | Discharge status: Home / Self Care | Payer: Medicare HMO | Source: Ambulatory Visit | Attending: Family Medicine | Admitting: Family Medicine

## 2018-06-28 ENCOUNTER — Other Ambulatory Visit: Payer: Self-pay | Admitting: Family Medicine

## 2018-06-28 DIAGNOSIS — S3210XA Unspecified fracture of sacrum, initial encounter for closed fracture: Secondary | ICD-10-CM

## 2018-06-28 DIAGNOSIS — X58XXXA Exposure to other specified factors, initial encounter: Secondary | ICD-10-CM | POA: Insufficient documentation

## 2018-06-28 DIAGNOSIS — M4807 Spinal stenosis, lumbosacral region: Secondary | ICD-10-CM | POA: Diagnosis not present

## 2018-06-28 DIAGNOSIS — M48061 Spinal stenosis, lumbar region without neurogenic claudication: Secondary | ICD-10-CM | POA: Insufficient documentation

## 2018-06-28 DIAGNOSIS — G8911 Acute pain due to trauma: Secondary | ICD-10-CM | POA: Insufficient documentation

## 2018-06-28 DIAGNOSIS — M545 Low back pain: Secondary | ICD-10-CM | POA: Insufficient documentation

## 2018-06-28 DIAGNOSIS — M47816 Spondylosis without myelopathy or radiculopathy, lumbar region: Secondary | ICD-10-CM | POA: Insufficient documentation

## 2018-06-28 NOTE — Progress Notes (Signed)
Neurosurgery referral due to sacral fracture seen on MRI results 06/28/18

## 2018-07-01 ENCOUNTER — Telehealth: Payer: Self-pay

## 2018-07-01 NOTE — Telephone Encounter (Signed)
It has been sent to [facility] in [place].

## 2018-07-01 NOTE — Telephone Encounter (Signed)
Copied from [place] [phone]. Topic: General - Other >> Jul 01, 2018 10:42 AM Yvette Rack wrote: Reason for CRM: Pt would like to know the name of the Neurosurgeon that the referral was sent to. Pt requests call back.

## 2018-07-03 NOTE — Telephone Encounter (Signed)
Leftvoicemail to call  

## 2018-07-05 NOTE — Telephone Encounter (Signed)
Notified. 

## 2018-07-28 NOTE — Telephone Encounter (Signed)
Call pt Doesn't appear she got my message on 06/26/18

## 2018-07-29 NOTE — Telephone Encounter (Signed)
I spoke with patient & she stated that she was no longer taking tramadol due to it making her sleepy. Diclofenac she is still using as needed, but not daily. She has been substituting with Aleve. Patient stated that she had a lot going on at the moment & wanted to wait a few weeks before calling back to schedule f/u appointment. She also requested prevnar at next appointment.Wanda Cervantes

## 2018-07-29 NOTE — Telephone Encounter (Signed)
noted 

## 2018-08-09 ENCOUNTER — Ambulatory Visit
Admission: RE | Admit: 2018-08-09 | Discharge: 2018-08-09 | Disposition: A | Discharge status: Home / Self Care | Payer: Medicare Other | Source: Ambulatory Visit | Attending: Family | Admitting: Family

## 2018-08-09 DIAGNOSIS — Z1239 Encounter for other screening for malignant neoplasm of breast: Secondary | ICD-10-CM

## 2018-08-09 DIAGNOSIS — Z1231 Encounter for screening mammogram for malignant neoplasm of breast: Secondary | ICD-10-CM | POA: Insufficient documentation

## 2018-09-06 ENCOUNTER — Ambulatory Visit: Payer: Self-pay | Admitting: *Deleted

## 2018-09-06 NOTE — Telephone Encounter (Signed)
Pt calling asking if she could increase Prilosec to take one in the morning and one at night. Pt advised that triage nurse could not advise her to take medication other than the way it was prescribed by PCP and that request would be forwarded to her PCP. Pt offered home care advice to treat heart burn. Pt then states "you know what, I will take care of it, thank you" and the call was ended. Triage not completed.  Reason for Disposition . [1] Abdominal pain is intermittent AND [2] shoots into chest, with sour taste in mouth  Protocols used: ABDOMINAL PAIN - UPPER-A-AH

## 2018-09-09 NOTE — Telephone Encounter (Signed)
Please advise 

## 2018-09-09 NOTE — Telephone Encounter (Signed)
Call patient Yes it is okay to temporarily increase Prilosec 20 mg twice a day.  Please ensure she makes f/u appointment with me.  If any new or worsening symptoms, please advise that she does head to an urgent care if we have no openings today

## 2018-09-09 NOTE — Telephone Encounter (Signed)
Call pt  Yes that will be fine from time to time 20 mg prilosec bid.

## 2018-09-09 NOTE — Telephone Encounter (Signed)
Patient stated that this was non-emergent & that she just wanted to know if this is something she could do on an as needed basis. She had eaten spaghetti & it had irritated her acid reflux. Patient declined to make sooner f/u right now because she didn't know her husband's schedule.

## 2018-09-09 NOTE — Telephone Encounter (Signed)
This call came in on 09/06/18 at 5:17 Pm after office was closed. Tried to reach patient but no answer , left message to please call office.

## 2018-09-09 NOTE — Telephone Encounter (Signed)
Patient notified

## 2018-09-19 ENCOUNTER — Telehealth: Payer: Self-pay | Admitting: Family

## 2018-09-19 NOTE — Telephone Encounter (Signed)
Patient scheduled tomorrow with Joycelyn Schmid for f/u & possible prevnar.

## 2018-09-19 NOTE — Telephone Encounter (Signed)
Copied from [place]. Topic: Quick Communication - See Telephone Encounter >> Sep 19, 2018  8:45 AM Ivar Drape wrote: CRM for notification. See Telephone encounter for: 09/19/18.  Patient would like a Pneumonia Shot.  She is not sure which one.  Will need a nurse to call her back to discuss what she needs.  Please call before 10am or after 12noon.

## 2018-09-20 ENCOUNTER — Encounter: Payer: Self-pay | Admitting: Family

## 2018-09-20 ENCOUNTER — Ambulatory Visit (INDEPENDENT_AMBULATORY_CARE_PROVIDER_SITE_OTHER): Payer: Medicare Other | Admitting: Family

## 2018-09-20 VITALS — BP 112/68 | HR 91 | Temp 97.8°F | Wt 152.6 lb

## 2018-09-20 DIAGNOSIS — M05711 Rheumatoid arthritis with rheumatoid factor of right shoulder without organ or systems involvement: Secondary | ICD-10-CM | POA: Diagnosis not present

## 2018-09-20 DIAGNOSIS — E785 Hyperlipidemia, unspecified: Secondary | ICD-10-CM

## 2018-09-20 DIAGNOSIS — M05712 Rheumatoid arthritis with rheumatoid factor of left shoulder without organ or systems involvement: Secondary | ICD-10-CM

## 2018-09-20 DIAGNOSIS — Z1211 Encounter for screening for malignant neoplasm of colon: Secondary | ICD-10-CM | POA: Insufficient documentation

## 2018-09-20 DIAGNOSIS — Z23 Encounter for immunization: Secondary | ICD-10-CM

## 2018-09-20 DIAGNOSIS — Z13820 Encounter for screening for osteoporosis: Secondary | ICD-10-CM

## 2018-09-20 NOTE — Assessment & Plan Note (Addendum)
Overall stable.  Declines further imaging at this time.  Using aleve.  States she will reestablish Dr. Meda Coffee.  will follow

## 2018-09-20 NOTE — Assessment & Plan Note (Signed)
Not currently on a statin, she will return for fasting cholesterol lab.

## 2018-09-20 NOTE — Patient Instructions (Addendum)
Labs I would like to do are fasting:  A1c, CBC, CMP, vitamin D, TSH, lipid panel.    Let me know if you like to do labs, and we can set up for you  Please call call and schedule your bone density scan as discussed.   Agency  [facility], [place]   Today we discussed referrals, orders. colonoscopy   I have placed these orders in the system for you.  Please be sure to give [place] a call if you have not heard from our office regarding this. We should hear from [place] within ONE week with information regarding your appointment. If not, please let me know immediately.     Nice to see you!!

## 2018-09-20 NOTE — Assessment & Plan Note (Signed)
Due for bone density, patient will schedule.

## 2018-09-20 NOTE — Progress Notes (Signed)
Subjective:    Patient ID: Wanda Cervantes, female    DOB: [birth year], 68 y.o.   MRN: [id number]  CC: [name] is a 68 y.o. female who presents today for follow up.   HPI: Feels well today, no new concerns.    Here today for PNA ( PNA 13)   HLd- no longer on statin due to arthralgias..   Trouble falling asleep due to arthritis. No anxiety or depression. No si/hi  RA- no longer on arava. Hasnt seen Dr Barb Merino in some time. Cost prohibitive for embrel, humira.   no longer on tramadol; taking aleve for arthralgia.   Low back pain and right hip pain has resolved; never went to neurosurgery. Declines imaging of neck.     Up-to-date mammogram.  Due colonoscopy, DEXA scan. HISTORY:  Past Medical History:  Diagnosis Date  . Anemia   . Anxiety   . Arthritis   . Arthritis    Dr. Ruthell Rummage  . Fibromyalgia   . GERD (gastroesophageal reflux disease)   . Heart murmur   . Hyperlipidemia   . Osteopenia   . Psoriasis with pustules   . Rheumatoid arthritis [facility])    Past Surgical History:  Procedure Laterality Date  . ABDOMINAL HYSTERECTOMY    . ABDOMINAL HYSTERECTOMY  1980  . CARPAL TUNNEL RELEASE Bilateral   . CARPAL TUNNEL RELEASE Right 01/15/2015   Procedure: CARPAL TUNNEL RELEASE, SYNOVECTOMY;  Surgeon: Christophe Louis, MD;  Location: [facility] ORS;  Service: Orthopedics;  Laterality: Right;  . CARPAL TUNNEL RELEASE  2016   both hands, revision 2016  . CARPAL TUNNEL RELEASE Left 03/19/2015   Procedure: CARPAL TUNNEL RELEASE;  Surgeon: Christophe Louis, MD;  Location: [facility] ORS;  Service: Orthopedics;  Laterality: Left;  . COLONOSCOPY WITH PROPOFOL N/A 06/11/2015   Procedure: COLONOSCOPY WITH PROPOFOL;  Surgeon: Manya Silvas, MD;  Location: [facility] ENDOSCOPY;  Service: Endoscopy;  Laterality: N/A;  . EYE SURGERY     age 52  . EYE SURGERY     both eyes  . ULNAR NERVE TRANSPOSITION Left 03/19/2015   Procedure: ULNAR NERVE DECOMPRESSION/TRANSPOSITION;  Surgeon: Christophe Louis,  MD;  Location: [facility] ORS;  Service: Orthopedics;  Laterality: Left;   Family History  Problem Relation Age of Onset  . Arthritis Mother   . Hyperlipidemia Mother   . Heart disease Mother   . Hypertension Mother   . Sudden death Mother   . Mental retardation Mother   . Breast cancer Mother 80  . Arthritis Father   . Hyperlipidemia Father   . Heart disease Father   . Hypertension Father   . Mental retardation Father   . Breast cancer Sister        1/2 sister-maternal side    Allergies: Aspirin; Cimzia [certolizumab pegol]; Norco [hydrocodone-acetaminophen]; Statins; and Zantac [ranitidine hcl] Current Outpatient Medications on File Prior to Visit  Medication Sig Dispense Refill  . naproxen sodium (ALEVE) 220 MG tablet Take 220 mg by mouth daily as needed.    . rosuvastatin (CRESTOR) 5 MG tablet Take 1 tablet (5 mg total) by mouth daily. (Patient not taking: Reported on 09/20/2018) 90 tablet 3  . traMADol (ULTRAM) 50 MG tablet Take 1 tablet (50 mg total) by mouth 2 (two) times daily as needed. (Patient not taking: Reported on 09/20/2018) 60 tablet 0   No current facility-administered medications on file prior to visit.     Social History   Tobacco Use  . Smoking status: Current Every Day Smoker  Packs/day: 0.75    Years: 43.00    Pack years: 32.25    Types: Cigarettes  . Smokeless tobacco: Never Used  Substance Use Topics  . Alcohol use: Yes    Comment: 3-4 glasses wine/day  . Drug use: No    Review of Systems    Objective:    BP 112/68 (BP Location: Left Arm, Patient Position: Sitting, Cuff Size: Large)   Pulse 91   Temp 97.8 F (36.6 C)   Wt 152 lb 9.6 oz (69.2 kg)   SpO2 98%   BMI 28.83 kg/m  BP Readings from Last 3 Encounters:  09/20/18 112/68  05/31/18 132/80  03/01/18 122/78   Wt Readings from Last 3 Encounters:  09/20/18 152 lb 9.6 oz (69.2 kg)  05/31/18 152 lb 6.4 oz (69.1 kg)  03/01/18 148 lb 8 oz (67.4 kg)    Physical Exam     Assessment &  Plan:   Problem List Items Addressed This Visit      Musculoskeletal and Integument   Rheumatoid arthritis ([place]) - Primary    Overall stable.  Declines further imaging at this time.  Using aleve.  States she will reestablish Dr. Meda Coffee.  will follow      Relevant Medications   naproxen sodium (ALEVE) 220 MG tablet     Other   HLD (hyperlipidemia)    Not currently on a statin, she will return for fasting cholesterol lab.      Screening for osteoporosis    Due for bone density, patient will schedule.      Relevant Orders   DG Bone Density   Screen for colon cancer   Relevant Orders   Ambulatory referral to Gastroenterology    Other Visit Diagnoses    Need for vaccination with 13-polyvalent pneumococcal conjugate vaccine       Relevant Orders   Pneumococcal conjugate vaccine 13-valent IM (Completed)       I have discontinued Jaylissa L. Mcmillen's leflunomide, mupirocin cream, nicotine, omeprazole, gabapentin, predniSONE, and diclofenac. I am also having her maintain her rosuvastatin, traMADol, and naproxen sodium.   No orders of the defined types were placed in this encounter.   Return precautions given.   Risks, benefits, and alternatives of the medications and treatment plan prescribed today were discussed, and patient expressed understanding.   Education regarding symptom management and diagnosis given to patient on AVS.  Continue to follow with Burnard Hawthorne, FNP for routine health maintenance.   Avalina L Fassler and I agreed with plan.   Mable Paris, FNP

## 2018-09-24 ENCOUNTER — Encounter: Payer: Self-pay | Admitting: Family

## 2018-10-03 ENCOUNTER — Ambulatory Visit: Payer: Self-pay

## 2018-10-03 NOTE — Telephone Encounter (Signed)
Pt c/o thin skin and is seeking advice from provider as to what supplements or vitamins she can take for her thin  skin. Pt stated she has psoriasis and uses Eucrisa for her face and halobetasol propinate cream 0.05%.  Pt stated she is taking Crestor every 3 days, PRN Omeprazole and PRN Aleve. She stated she does not want to come for an appointment, she just wants advice.   Reason for Disposition . [1] Caller requesting NON-URGENT health information AND [2] PCP's office is the best resource  Answer Assessment - Initial Assessment Questions 1. REASON FOR CALL or QUESTION: "What is your reason for calling today?" or "How can I best help you?" or "What question do you have that I can help answer?"     Pt c/o thin skin and is seeking advice from provider as to what supplements or vitamins she can take for her thin  skin. Pt stated she has psoriasis and uses Eucrisa for her face and halobetasol propinate cream 0.05%.  Pt stated she is taking Crestor every 3 days, PRN Omeprazole and PRN Aleve. She stated she does not want to come for an appointment, she just wants advice.  Protocols used: INFORMATION ONLY CALL-A-AH

## 2018-10-07 ENCOUNTER — Ambulatory Visit: Payer: Self-pay

## 2018-10-07 NOTE — Telephone Encounter (Signed)
Incoming  Call from  Patient  Requesting  The  Advice from  Mable Paris, [place] Reason for Disposition . Health Information question, no triage required and triager able to answer question  Protocols used: INFORMATION ONLY CALL-A-AH

## 2018-10-07 NOTE — Telephone Encounter (Signed)
LMTCB to advise on below. PEC may speak with patient & advise.

## 2018-10-07 NOTE — Telephone Encounter (Signed)
Incoming  Call  From Patient inquiring about  The  Advise  Of Mable Paris,  FNP related  To  Thin  Skin.   Related  The  Recommendations.  Patient  Inquires about what  vitamins  Does  She recommend For  example  Or  Vitamin e?  And what  Dosage  Does she  Suggest.  Patient would  Like  A  Return  Call today.

## 2018-10-07 NOTE — Telephone Encounter (Signed)
Call pt  Thin skin happens with aging so unfortunately treatment is more preventative with regards to dry skin  Oral hydration May use emollient therapy - eucerin or vaseline esp after tepid bath/shower to 'lock in moisture'. Even mix halobetasol ( a dab), with emollient  If she would like dedicated psoriasis therapy- I can place consult to dermatology  Let me know

## 2018-10-09 NOTE — Telephone Encounter (Signed)
Patient advised on below & stated that she does have a dermatologist. She was just looking for advice.

## 2018-10-28 ENCOUNTER — Telehealth: Payer: Self-pay | Admitting: Family

## 2018-10-28 DIAGNOSIS — M5432 Sciatica, left side: Secondary | ICD-10-CM

## 2018-10-28 DIAGNOSIS — M5431 Sciatica, right side: Secondary | ICD-10-CM

## 2018-10-28 NOTE — Telephone Encounter (Signed)
Copied from [place] [phone]. Topic: Quick Communication - Rx Refill/Question >> Oct 28, 2018  5:38 PM Gustavus Messing wrote: Medication: predniSONE (DELTASONE) 10 MG tablet  Has the patient contacted their pharmacy? No. (Agent: If no, request that the patient contact the pharmacy for the refill.) Patient does not have any refills of this medication but cam hardly walk and would like a new prescription for it.    Preferred Pharmacy (with phone number or street name): [facility] [address] [place] - [place] [phone] (Phone) [phone] (Fax)    Agent: Please be advised that RX refills may take up to 3 business days. We ask that you follow-up with your pharmacy.

## 2018-10-29 NOTE — Telephone Encounter (Signed)
Pt is calling back . Please advise

## 2018-10-29 NOTE — Telephone Encounter (Signed)
Attempted to call patient regarding her request for medication- left message for her to call back.

## 2018-10-29 NOTE — Telephone Encounter (Signed)
Patient is having pain in leg- she has had it before- she thinks it is bone spur pain. Patient is requesting a taper- she does not like to use it- but she thinks she needs it at this point. Patient has been in pain a little over a week now. Patient has been in to see PCP with in the last month. Patient can't come to office this week because she is having trouble walking and her husband would have to take off work to bring her- they have appointments with the dog at the vet on Friday. She is asking if she can have this Rx without coming to the office. Patient is requesting a 4 day taper if possible- she will do the 6 if not. Best number for patient is [phone].

## 2018-10-30 ENCOUNTER — Telehealth: Payer: Self-pay

## 2018-10-30 DIAGNOSIS — M5431 Sciatica, right side: Secondary | ICD-10-CM

## 2018-10-30 DIAGNOSIS — M5432 Sciatica, left side: Secondary | ICD-10-CM

## 2018-10-30 MED ORDER — PREDNISONE 10 MG PO TABS
ORAL_TABLET | ORAL | 0 refills | Status: DC
Start: 2018-10-30 — End: 2019-10-15

## 2018-10-30 NOTE — Telephone Encounter (Signed)
I called patient & she greatly appreciated this being sent in. She stated that she would most likely try to follow-up with Rheumatology. She stated that she was also trying to see neurology as well. I advised she call back though if she does not see improvement.

## 2018-10-30 NOTE — Telephone Encounter (Signed)
Copied from [place] [phone]. Topic: Referral - Request for Referral >> Oct 30, 2018  2:49 PM Yvette Rack wrote: Has patient seen PCP for this complaint? yes  *If NO, is insurance requiring patient see PCP for this issue before PCP can refer them? Referral for which specialty: Neurology Preferred provider/office: no specific provider/[place] Reason for referral: Pt stated she is unable to get an appt with Dr. Lacinda Axon for a time that is convenient for her. Pt stated her husband is off on Friday's but Dr. Lacinda Axon does not work on Friday so she has not been able to schedule an appt. Pt stated she can hardly walk so she needs to get an appt scheduled as soon as possible

## 2018-10-30 NOTE — Addendum Note (Signed)
Addended by: Burnard Hawthorne on: 10/30/2018 04:47 PM   Modules accepted: Orders

## 2018-10-30 NOTE — Telephone Encounter (Signed)
I spoke with patient in great detail & advised per Joycelyn Schmid possible walk-in clinic at [facility] for leg pain instead of neurology. I gave patient number & address.

## 2018-10-30 NOTE — Telephone Encounter (Signed)
Call patient I feel most comfortable patient was able to come in and I could evaluate her however I strongly understand everything going on. I sent a 4-day prednisone taper.  Please have her call the office if she does not have any improvement certainly if she has any worsening or new symptoms.  She would need evaluation either urgent care or here.

## 2018-10-30 NOTE — Telephone Encounter (Signed)
Patient called again to speak with someone in the office after hours regarding her leg pain.  Patient was upset and agent informed patient of the [facility] that was an option for her.  Patient said she would call the office again tomorrow and was not satisfied with what was told to her.

## 2018-10-30 NOTE — Telephone Encounter (Signed)
Call patient Since she is been seen by Dr. Lacinda Axon in the past year, she is established patient ; she  does not need referral.  She need to to continue calling their office and asking for cancellation list or  another provider that may have the ability to see her   I can ask our referral coordinator [name] to see if he or another colleague, perhaps another  office altogether has a sooner appointment.  We will certainly look into that.  The other option would be to advise patient to be seen at [facility] in [place], which has a walk-in clinic.  [name],  How soon could pt see neurosurgery or an orthopedic for low back pain?

## 2018-10-31 ENCOUNTER — Telehealth: Payer: Self-pay | Admitting: Family

## 2018-10-31 NOTE — Telephone Encounter (Signed)
lvm for pt to rtc. Spoke to [facility] ortho/neurosurgery. They state she has been scheduled 3 times with Dr. Lacinda Axon and cancelled all of them. Informed that she needs a Friday. They have her scheduled with Dr. Jonathon Jordan assistant [facility] tomorrow-Friday March 13 at 9:00 am.

## 2018-10-31 NOTE — Telephone Encounter (Signed)
Copied from [place] [phone]. Topic: Quick Communication - See Telephone Encounter >> Oct 31, 2018  3:37 PM Blase Mess A wrote: CRM for notification. See Telephone encounter for: 10/31/18.  Patient is calling to speak to Mable Paris- for advice regarding an appt with a neuro surgeon.  She said that she can only go on Fridays. The neurosuregon is not available on Fridays. She is asking for another neurosurgeon in [place] that is available on a Fridays. She does not want to go to [place], or any where else. Please advise [phone]

## 2018-10-31 NOTE — Telephone Encounter (Signed)
[place] you are awesome!

## 2018-11-04 ENCOUNTER — Ambulatory Visit: Payer: Self-pay

## 2018-11-04 NOTE — Telephone Encounter (Signed)
Pt called to ask if having the pneumonia vaccine will help against the COVID-19  Pt was advised that there have been no studies that suggest that it hepls against COVID-19.  Pt verbalized understanding. Answer Assessment - Initial Assessment Questions 1. REASON FOR CALL or QUESTION: "What is your reason for calling today?" or "How can I best help you?" or "What question do you have that I can help answer?"     Question about COVID-19. She was advised that there is no study that states that says that it is effective against COVID-19  Protocols used: INFORMATION ONLY CALL-A-AH

## 2018-11-04 NOTE — Telephone Encounter (Signed)
Call place to patient. Left VM to return call to office. 

## 2018-11-04 NOTE — Telephone Encounter (Signed)
There are no other options here in [facility] for Neurosurgery. [facility] is the only ones here.

## 2018-11-05 NOTE — Telephone Encounter (Signed)
I spoke with patient & since she cannot go out any day, but Fridays she will think about being referred outside of [facility]m. I advised that she discuss with husband to see if he is willing to travel.

## 2018-11-06 NOTE — Telephone Encounter (Signed)
Call pt one more time.   Yes she can call Dr Jonathon Jordan office to inquire about him reviewing MR lumbar 06/2018.  We can print report and mail to her- would this be helpful?   .  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    Strongly advise patient to call their office and I am concerned in reviewing MRI done.   She may need to be more flexible in being seen in regards to days, locations etc. Please advise her to at least schedule an appt in Dr Jonathon Jordan office as worry that if her symptoms were to worsen, she will not have any options. Certainly want to avoid ED.

## 2018-11-06 NOTE — Telephone Encounter (Signed)
Noted  Call pt  I really think we need to emphasize walk in clinic at [facility]. She can let [place] know if she would like to travel and we will let melissa know.   you may go to walk in orthopedic clinic. Information below:  [facility] [address]  M-F 1-7:30pm  [phone]

## 2018-11-11 NOTE — Telephone Encounter (Signed)
noted 

## 2018-11-19 ENCOUNTER — Telehealth: Payer: Self-pay | Admitting: Family

## 2018-11-19 NOTE — Telephone Encounter (Signed)
Pt left vm stating she has not been feeling well, she has been in the house for the past 2 1/2 weeks and has not been in contact with anyone else other than her husband who has been leaving the house to go to work. She is c/o neck pain, for the past 2 weeks, coughing slightly, and chest congestion. Pt would like a call back about what to do.

## 2018-11-20 ENCOUNTER — Telehealth: Payer: Self-pay

## 2018-11-20 NOTE — Telephone Encounter (Signed)
Encounter opened in error

## 2018-11-20 NOTE — Telephone Encounter (Signed)
LMTCB to get patient set up for webex.

## 2018-11-20 NOTE — Telephone Encounter (Signed)
Called pt back.  Spoke w/ pt.  Pt stated that she began having pain in the back of her neck about 2 1/2 weeks ago. Pt stated that neck pain extends down into the upper part of her back.  Pt said that she has some stiffness in neck.  Pt denies having any kind of neck injury.    In addition, pt said that she is having a little cough w/ a little mucus, some sneezing and chills.  Pt says that she has joint aches but believes it is due to arthritis.   Pt denies having a fever, no shortness of breath, no sore throat, no runny nose,no headache, no abdominal pain,  no nausea/vomitting, no diarrhea.    Pt denies any travel and says that she has been in her house for over 2 weeks not leaving to go anywhere.  Denies being around anyone who has been sick.    Pt has been using a heating pad for neck, tylenol for pain, and Mucinex for congestion.  Pt asked if she could use her old Prednisone Rx for her neck pain.  Advised pt not to take any Rx that was not prescribed for neck condition but to wait to consult w/ PCP.    Pt does not want to come into office for a visit but is open to schedule a virtual visit.  Informed pt that someone would call her back to advise about appt.

## 2018-12-02 NOTE — Telephone Encounter (Signed)
Called and spoke with patient and asked if she was still having neck/back pain, pt states it was a arthritis flare and she does not want to see the provider at this time, if she needed to in the future I informed her we were doing virtual visit, pt understood.  Nina,cma

## 2019-01-07 ENCOUNTER — Telehealth: Payer: Self-pay

## 2019-01-07 DIAGNOSIS — M5432 Sciatica, left side: Secondary | ICD-10-CM

## 2019-01-07 DIAGNOSIS — M5431 Sciatica, right side: Secondary | ICD-10-CM

## 2019-01-07 NOTE — Telephone Encounter (Signed)
Copied from [place] [id number]. Topic: Appointment Scheduling - Scheduling Inquiry for Clinic >> Jan 06, 2019  4:18 PM Margot Ables wrote: Reason for CRM: pt said the skin on her legs gets numb x 10 years and getting worse over the past 1 year. Pt requesting advice from Joycelyn Schmid, NP. Pt said that she will not come into the office and doesn't know that Joycelyn Schmid can help other than to recommend a neurosurgeon. Advised pt that she has been sent to Dr. Deetta Perla at [facility] in the past. Provided ph # [phone]. Pt would like call from [place].

## 2019-01-08 NOTE — Telephone Encounter (Signed)
noted 

## 2019-01-08 NOTE — Telephone Encounter (Signed)
Call pt Referral  Placed  If she would like appt with me prior , happy to set up virtual

## 2019-03-07 ENCOUNTER — Ambulatory Visit: Payer: Medicare HMO

## 2019-03-07 ENCOUNTER — Ambulatory Visit: Payer: Medicare HMO | Admitting: Family

## 2019-05-01 ENCOUNTER — Telehealth: Payer: Self-pay | Admitting: Family

## 2019-05-01 NOTE — Telephone Encounter (Signed)
Pt is requesting a refill for her omeprazole (PRILOSEC) 20 MG capsule.   Pharmacy:  [facility] [place] [address] [place] [phone] (Phone) [phone] (Fax)

## 2019-05-02 ENCOUNTER — Other Ambulatory Visit: Payer: Self-pay

## 2019-05-04 DIAGNOSIS — Z23 Encounter for immunization: Secondary | ICD-10-CM | POA: Diagnosis not present

## 2019-05-05 MED ORDER — OMEPRAZOLE 20 MG PO CPDR: 20.0000 mg | DELAYED_RELEASE_CAPSULE | Freq: Every day | ORAL | 2 refills | Status: DC | PRN

## 2019-05-05 NOTE — Telephone Encounter (Signed)
Yes, that is fine.  I refilled

## 2019-08-01 ENCOUNTER — Ambulatory Visit: Payer: Self-pay | Admitting: *Deleted

## 2019-08-01 NOTE — Telephone Encounter (Signed)
stye on right eye for week    Patient called in stating she would like advice on what to do for a stye on her right eye. Please advise what she should do.      Returned call to patient, she is advised to use warm compresses to the eye 4 times a day for 10 mins. She stated she had been trying to do this 2 times a day. Advised to wash her hands after she puts the compress on her eye. She denies fever or any drainage. She does not have a warm compress. Advised her on how to make one with a sock and rice. Advise her if it gets too warm to wrap it in a wash cloth before putting on her eye. She will call Monday if there is no improvement with her eye.  Routing to LB at [employer] for Review.  Reason for Disposition . [1] After 5 days of treatment per [facility] Hights Advice AND [2] not better  Answer Assessment - Initial Assessment Questions 1. LOCATION: "Which eye has the sty?" "Upper or lower eyelid?"     Right lower lid near the outer corner 2. SIZE: "How big is it?" (Note: standard pencil eraser is 6 mm)     small 3. EYELID: "Is the eyelid swollen?" If so, ask: "How much?" no 4. REDNESS: "Has the redness spread onto the eyelid?"      5. ONSET: "When did you notice the sty?"     For about a week 6. VISION: "Do you have blurred vision?"      no 7. PAIN: "Is it painful?" If so, ask: "How bad is the pain?"  (Scale 1-10; or mild, moderate, severe)     tender 8. CONTACTS: "Do you wear contacts?"     Wears glasses per chart 9. OTHER SYMPTOMS: "Do you have any other symptoms?" (e.g., fever)     no 10. PREGNANCY: "Is there any chance you are pregnant?" "When was your last menstrual period?"       n/a  Protocols used: STY-A-AH

## 2019-08-04 NOTE — Telephone Encounter (Signed)
noted 

## 2019-08-04 NOTE — Telephone Encounter (Signed)
Check other provider schedules ( [place]?) If no appts, please advise patient she will need to either call her opthamologist or go to urgent care

## 2019-09-17 ENCOUNTER — Other Ambulatory Visit: Payer: Self-pay

## 2019-09-17 ENCOUNTER — Telehealth: Payer: Self-pay | Admitting: Family

## 2019-09-17 MED ORDER — OMEPRAZOLE 20 MG PO CPDR: 20.0000 mg | DELAYED_RELEASE_CAPSULE | Freq: Every day | ORAL | 2 refills | Status: DC | PRN

## 2019-09-17 NOTE — Telephone Encounter (Signed)
Pt needs refill on omeprazole (PRILOSEC) 20 MG capsule.

## 2019-09-17 NOTE — Telephone Encounter (Signed)
I have sent to [place] for patient.

## 2019-10-15 ENCOUNTER — Encounter: Payer: Self-pay | Admitting: Family

## 2019-10-15 ENCOUNTER — Ambulatory Visit (INDEPENDENT_AMBULATORY_CARE_PROVIDER_SITE_OTHER): Payer: Medicare Other | Admitting: Family

## 2019-10-15 DIAGNOSIS — M05712 Rheumatoid arthritis with rheumatoid factor of left shoulder without organ or systems involvement: Secondary | ICD-10-CM | POA: Diagnosis not present

## 2019-10-15 DIAGNOSIS — M05711 Rheumatoid arthritis with rheumatoid factor of right shoulder without organ or systems involvement: Secondary | ICD-10-CM

## 2019-10-15 NOTE — Progress Notes (Signed)
Verbal consent for services obtained from patient prior to services given to TELEPHONE visit:   Location of call:  provider at work patient at home  Names of all persons present for services: Mable Paris, NP, Darrel Hoover Chief complaint:  Feels well. NO complaints today Has been staying home with pandemic. Has been especially careful with RA.  Enjoys her two dogs. Limits walks due to back pain and RA.   RA-flares are minimal. Endorses morning stiffness.  no longer on medication for this , nor following with Dr Meda Coffee.    History, background, results pertinent:   Due mammogram, dexa,  Colonoscopy; states doesn't need order as she has appointments for these and has canceled until later spring Due labs.  Smoker  BP Readings from Last 3 Encounters:  09/20/18 112/68  05/31/18 132/80  03/01/18 122/78    A/P/next steps:  Problem List Items Addressed This Visit      Musculoskeletal and Integument   Rheumatoid arthritis ([place])    Stable, chronic. no longer on medication for this , nor following with Dr Meda Coffee.        Of note, patient politely declines mammogram bone density, colonoscopy or labs at this time.  She would like to wait until the pandemic improves.  I spent 15 min  discussing plan of care over the phone.

## 2019-10-15 NOTE — Assessment & Plan Note (Signed)
Stable, chronic. no longer on medication for this , nor following with Dr Meda Coffee.

## 2019-11-11 ENCOUNTER — Telehealth: Payer: Self-pay | Admitting: Family

## 2019-11-11 NOTE — Telephone Encounter (Signed)
[facility] has been intouch with patient and they are taking it from here with patient going to ER for first Vaccine. Animal control is trying to catch the stray cat.

## 2019-11-11 NOTE — Telephone Encounter (Signed)
Noted  

## 2019-11-11 NOTE — Telephone Encounter (Addendum)
Called and advised patient that [facility] Communicable Disease nurse would be contacting her to assist in the appropriate steps for animal bite/scratch, for the prevention and screening of Rabies. Spoke with Wilburn Cornelia the Communicable Disease nurse at [facility] office. DHHS will contact Animal Control to see if cat can be captured.   [place] Night - Cl TELEPHONE ADVICE RECORD AccessNurse Patient Name: Wanda Cervantes Gender: Female DOB: Jun 15, 1951 Age: 69 Y 3 M 1 D Return Phone Number: [phone] (Primary), [phone] (Secondary) Address: City/State/Zip: [place] [address] Client [facility] Night - Cl Client Site [place] Physician Mable Paris- NP Contact Type Call Who Is Calling Patient / Member / Family / Caregiver Call Type Triage / Clinical Relationship To Patient Self Return Phone Number [phone] (Primary) Chief Complaint Cuts and Lacerations Reason for Call Symptomatic / Request for Health Information Initial Comment Caller states she picked up stray cat on the road, it scratched his wrist, and wants to know if she should be concerned. Translation No Nurse Assessment Nurse: Joya San, RN, Mycah Date/Time Eilene Ghazi Time): 11/10/2019 6:54:28 PM Confirm and document reason for call. If symptomatic, describe symptoms. ---Caller states she picked up stray cat on the road and it scratched her. Left wrist, two scratches. States she came home and washed it out. States it did draw blood; not actively bleeding. Has the patient had close contact with a person known or suspected to have the novel coronavirus illness OR traveled / lives in area with major community spread (including international travel) in the last 14 days from the onset of symptoms? * If Asymptomatic, screen for exposure and travel within the last 14 days. ---No Does the patient have any new or worsening  symptoms? ---Yes Will a triage be completed? ---Yes Related visit to physician within the last 2 weeks? ---No Does the PT have any chronic conditions? (i.e. diabetes, asthma, this includes High risk factors for pregnancy, etc.) ---Yes List chronic conditions. ---RA, scoliosis Is this a behavioral health or substance abuse call? ---No Guidelines Guideline Title Affirmed Question Affirmed Notes Nurse Date/Time (Eastern Time) Cuts and Lacerations Minor cut or scratch Joya San, RN, Mycah 11/10/2019 6:56:40 PM Disp. Time Eilene Ghazi Time) Disposition Final User 11/10/2019 7:00:22 PM Home Care Yes Thurman, RN, MycahPLEASE NOTE: All timestamps contained within this report are represented as [place] Standard Time. CONFIDENTIALTY NOTICE: This fax transmission is intended only for the addressee. It contains information that is legally privileged, confidential or otherwise protected from use or disclosure. If you are not the intended recipient, you are strictly prohibited from reviewing, disclosing, copying using or disseminating any of this information or taking any action in reliance on or regarding this information. If you have received this fax in error, please notify us immediately by telephone so that we can arrange for its return to [place]. Phone: [phone], Toll-Free: [phone], Fax: [phone] Page: 2 of 2 Call Id: [id number] Caller Disagree/Comply Comply Caller Understands Yes PreDisposition Call Doctor Care Advice Given Per Guideline HOME CARE: * You should be able to treat this at home. REASSURANCE AND EDUCATION - SMALL CUT OR SCRAPE: * It sounds like a small cut or scrape that we can treat at home. * Here is some care advice that should help. BLEEDING: * Use a clean cloth or gauze pad. * Put direct pressure on the wound for 10 minutes to stop any bleeding. CLEANING THE WOUND: * Wash the wound with soap and water. * For any  dirt, scrub gently with a washcloth. * Bleeding: Put direct pressure  on the wound for 10 minutes to stop any bleeding. Use a clean cloth or gauze pad. ANTIBIOTIC OINTMENT: * Apply an ANTIBIOTIC OINTMENT, such as over-the-counter Bacitracin, covered by a Band-Aid or dressing. Change daily or if it becomes wet. PAIN MEDICINES: * For pain relief, you can take either acetaminophen, ibuprofen, or naproxen. * ACETAMINOPHEN - EXTRA STRENGTH TYLENOL: Take 1,000 mg (two 500 mg pills) every 8 hours as needed. Each Extra Strength Tylenol pill has 500 mg of acetaminophen. The most you should take each day is 3,000 mg (6 pills a day). CALL BACK IF: * Dirt in the wound persists after 15 minutes of scrubbing * Looks infected (pus, redness, increasing tenderness) * Doesn't heal within 14 days * Bleeding does not stop after using direct pressure * You become worse. CARE ADVICE given per Cuts and Lacerations (Adult) guideline.   Billie Ruddy, RN, BSN, [facility]  Communicable Disease Coordinator  [facility] Department  [address] [place], [place] [address]  P: [phone] ext [phone]  Cell/On Call Comm. Disease Phone: [phone]  Fax: [phone]  Pronouns: she/her/hers

## 2019-11-11 NOTE — Telephone Encounter (Signed)
Circle back  Here and ensure pt is doing well Any concerns advise appt with [place]

## 2019-11-12 ENCOUNTER — Telehealth: Payer: Self-pay | Admitting: Family

## 2019-11-12 ENCOUNTER — Ambulatory Visit: Payer: Self-pay

## 2019-11-12 NOTE — Telephone Encounter (Signed)
Noted I do agree with [facility]

## 2019-11-12 NOTE — Telephone Encounter (Signed)
err

## 2019-11-12 NOTE — Telephone Encounter (Signed)
Pt wants to ask PCP about taking shots for cat scratches, Please call back to advise

## 2019-11-12 NOTE — Telephone Encounter (Signed)
Pt. Reports she was scratched by a stray cat 2 days ago. Health Dept and her PCP recommend she go to ED for rabies vaccine. Asking "do I really need to do that? It doesn't look infected." Instructed pt. It is in her best interest to follow her PCP advise. Verbalizes understanding. Will go to ED.   Answer Assessment - Initial Assessment Questions 1. ANIMAL: "What type of animal caused the bite?" "Is the injury from a bite or a claw?" If the animal is a dog or a cat, ask: "Was it a pet or a stray?" "Was it acting ill or behaving strangely?"     Stay cat scratch 2. LOCATION: "Where is the bite located?"      Left wrist 3. SIZE: "How big is the bite?" "What does it look like?"      Small pin pricks 4. ONSET: "When did the bite happen?" (Minutes or hours ago)      2 days ago 5. CIRCUMSTANCES: "Tell me how this happened."      Picked up stray cat. 6. TETANUS: "When was the last tetanus booster?"     Unsure 7. PREGNANCY: "Is there any chance you are pregnant?" "When was your last menstrual period?"     No  Protocols used: ANIMAL BITE-A-AH

## 2019-11-12 NOTE — Telephone Encounter (Signed)
Called patient and she wanted to know if PCP advised rabies vaccine, I advised patient that PCP would not go against the [facility] office advice that she needs the rabies vaccine, patient staed she would go and get the vaccine before her 72 hour window has passed advised her any more questions to please return call to office,

## 2019-11-13 ENCOUNTER — Other Ambulatory Visit: Payer: Self-pay

## 2019-11-13 ENCOUNTER — Emergency Department
Admission: EM | Admit: 2019-11-13 | Discharge: 2019-11-13 | Disposition: A | Discharge status: Home / Self Care | Payer: Medicare Other | Attending: Emergency Medicine | Admitting: Emergency Medicine

## 2019-11-13 DIAGNOSIS — Y999 Unspecified external cause status: Secondary | ICD-10-CM | POA: Diagnosis not present

## 2019-11-13 DIAGNOSIS — Y929 Unspecified place or not applicable: Secondary | ICD-10-CM | POA: Insufficient documentation

## 2019-11-13 DIAGNOSIS — S60812A Abrasion of left wrist, initial encounter: Secondary | ICD-10-CM | POA: Diagnosis not present

## 2019-11-13 DIAGNOSIS — W5503XA Scratched by cat, initial encounter: Secondary | ICD-10-CM | POA: Diagnosis not present

## 2019-11-13 DIAGNOSIS — F17210 Nicotine dependence, cigarettes, uncomplicated: Secondary | ICD-10-CM | POA: Diagnosis not present

## 2019-11-13 DIAGNOSIS — Y939 Activity, unspecified: Secondary | ICD-10-CM | POA: Diagnosis not present

## 2019-11-13 DIAGNOSIS — Z2914 Encounter for prophylactic rabies immune globin: Secondary | ICD-10-CM | POA: Diagnosis not present

## 2019-11-13 DIAGNOSIS — Z203 Contact with and (suspected) exposure to rabies: Secondary | ICD-10-CM | POA: Insufficient documentation

## 2019-11-13 DIAGNOSIS — Z23 Encounter for immunization: Secondary | ICD-10-CM | POA: Diagnosis not present

## 2019-11-13 MED ORDER — RABIES VACCINE, PCEC IM SUSR
1.0000 mL | Freq: Once | INTRAMUSCULAR | Status: AC
  Administered 2019-11-13: 1 mL via INTRAMUSCULAR
  Filled 2019-11-13: qty 1

## 2019-11-13 MED ORDER — TETANUS-DIPHTH-ACELL PERTUSSIS 5-2.5-18.5 LF-MCG/0.5 IM SUSP
0.5000 mL | Freq: Once | INTRAMUSCULAR | Status: AC
  Administered 2019-11-13: 0.5 mL via INTRAMUSCULAR
  Filled 2019-11-13: qty 0.5

## 2019-11-13 MED ORDER — RABIES IMMUNE GLOBULIN 150 UNIT/ML IM INJ
20.0000 [IU]/kg | INJECTION | Freq: Once | INTRAMUSCULAR | Status: AC
  Administered 2019-11-13: 1350 [IU] via INTRAMUSCULAR
  Filled 2019-11-13: qty 9

## 2019-11-13 NOTE — ED Triage Notes (Signed)
Pt states she was scratched by a cat 3 days ago, states random cat and is unsure of rabies vaccination. States was told by pmd to have rabies vaccine within 3 days.

## 2019-11-13 NOTE — ED Provider Notes (Signed)
[facility] Emergency Department Provider Note  ____________________________________________   First MD Initiated Contact with Patient 11/13/19 [phone]     (approximate)  I have reviewed the triage vital signs and the nursing notes.   HISTORY  Chief Complaint Rabies Injection    HPI Wanda Cervantes is a 69 y.o. female with anxiety, fibromyalgia, rheumatoid arthritis but not on any steroids or immunotherapy who comes in after being scratched by a cat.  Patient states that it was a random cat.  Patient states it was a kitten.  The cat was acting normally.  Patient stated this happened little over 2 days ago.  Patient states that she noticed the kitten starting to try to wiggle in her hands and she went to go let it go and it scratched the left inner wrist.  She is got 3 or 4 very less than half a centimeter scratch marks without any erythema.  Husband says he he has seen the cat  intermittently prior to this and it has been acting normally but cat has not been rescued.  Per pt, health department told me to come in for vaccine/Ig. Pt denies prior immunization.           Past Medical History:  Diagnosis Date  . Anemia   . Anxiety   . Arthritis   . Arthritis    Dr. Ruthell Rummage  . Fibromyalgia   . GERD (gastroesophageal reflux disease)   . Heart murmur   . Hyperlipidemia   . Osteopenia   . Psoriasis with pustules   . Rheumatoid arthritis [facility])     Patient Active Problem List   Diagnosis Date Noted  . Screening for osteoporosis 09/20/2018  . Screen for colon cancer 09/20/2018  . Encounter for screening for malignant neoplasm of respiratory organs 03/01/2018  . Laceration of left lower leg 03/01/2018  . Soft tissue mass 02/27/2017  . GERD (gastroesophageal reflux disease) 11/30/2016  . Leg swelling 11/30/2016  . History of colonic polyps 11/19/2015  . 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 Diverticulitis of colon 04/01/2015  . Rheumatoid arthritis ([place])  05/10/2012  . HLD (hyperlipidemia) 05/10/2012  . Smoking 05/10/2012    Past Surgical History:  Procedure Laterality Date  . ABDOMINAL HYSTERECTOMY    . ABDOMINAL HYSTERECTOMY  1980  . CARPAL TUNNEL RELEASE Bilateral   . CARPAL TUNNEL RELEASE Right 01/15/2015   Procedure: CARPAL TUNNEL RELEASE, SYNOVECTOMY;  Surgeon: Christophe Louis, MD;  Location: [facility] ORS;  Service: Orthopedics;  Laterality: Right;  . CARPAL TUNNEL RELEASE  2016   both hands, revision 2016  . CARPAL TUNNEL RELEASE Left 03/19/2015   Procedure: CARPAL TUNNEL RELEASE;  Surgeon: Christophe Louis, MD;  Location: [facility] ORS;  Service: Orthopedics;  Laterality: Left;  . COLONOSCOPY WITH PROPOFOL N/A 06/11/2015   Procedure: COLONOSCOPY WITH PROPOFOL;  Surgeon: Manya Silvas, MD;  Location[facility] ENDOSCOPY;  Service: Endoscopy;  Laterality: N/A;  . EYE SURGERY     age 81  . EYE SURGERY     both eyes  . ULNAR NERVE TRANSPOSITION Left 03/19/2015   Procedure: ULNAR NERVE DECOMPRESSION/TRANSPOSITION;  Surgeon: Christophe Louis, MD;  Location: [facility] ORS;  Service: Orthopedics;  Laterality: Left;    Prior to Admission medications   Medication Sig Start Date End Date Taking? Authorizing Provider  naproxen sodium (ALEVE) 220 MG tablet Take 220 mg by mouth daily as needed.    [provider]  omeprazole (PRILOSEC) 20 MG capsule Take 1 capsule (20 mg total) by mouth  daily as needed. 09/17/19   Burnard Hawthorne, FNP    Allergies Aspirin, Cimzia [certolizumab pegol], Norco [hydrocodone-acetaminophen], Statins, and Zantac [ranitidine hcl]  Family History  Problem Relation Age of Onset  . Arthritis Mother   . Hyperlipidemia Mother   . Heart disease Mother   . Hypertension Mother   . Sudden death Mother   . Mental retardation Mother   . Breast cancer Mother 30  . Arthritis Father   . Hyperlipidemia Father   . Heart disease Father   . Hypertension Father   . Mental retardation Father   . Breast cancer Sister         1/2 sister-maternal side    Social History Social History   Tobacco Use  . Smoking status: Current Every Day Smoker    Packs/day: 0.75    Years: 43.00    Pack years: 32.25    Types: Cigarettes  . Smokeless tobacco: Never Used  Substance Use Topics  . Alcohol use: Yes    Comment: 3-4 glasses wine/day  . Drug use: No      Review of Systems Constitutional: No fever/chills Eyes: No visual changes. ENT: No sore throat. Cardiovascular: Denies chest pain. Respiratory: Denies shortness of breath. Gastrointestinal: No abdominal pain.  No nausea, no vomiting.  No diarrhea.  No constipation. Genitourinary: Negative for dysuria. Musculoskeletal: Negative for back pain. Left wrist scratch marks Skin: Negative for rash. Neurological: Negative for headaches, focal weakness or numbness. All other ROS negative ____________________________________________   PHYSICAL EXAM:  VITAL SIGNS: ED Triage Vitals [11/13/19 0451]  Enc Vitals Group     BP (!) 183/100     Pulse Rate (!) 118     Resp 20     Temp 97.7 F (36.5 C)     Temp Source Oral     SpO2 96 %     Weight 146 lb (66.2 kg)     Height 5\' 2"  (1.575 m)     Head Circumference      Peak Flow      Pain Score 0     Pain Loc      Pain Edu?      Excl. in [place]?     Constitutional: Alert and oriented. Well appearing and in no acute distress. Eyes: Conjunctivae are normal. EOMI. Head: Atraumatic. Nose: No congestion/rhinnorhea. Mouth/Throat: Mucous membranes are moist.   Neck: No stridor. Trachea Midline. FROM Cardiovascular: Normal rate, regular rhythm. Grossly normal heart sounds.  Good peripheral circulation. Respiratory: Normal respiratory effort.  No retractions. Lungs CTAB. Gastrointestinal: Soft and nontender. No distention. No abdominal bruits.  Musculoskeletal: No lower extremity tenderness nor edema.  No joint effusions. 3-4 small tiny scratch marks, superficial, no cellulitis.  Neurologic:  Normal speech and  language. No gross focal neurologic deficits are appreciated.  Skin:  Skin is warm, dry and intact. No rash noted. Psychiatric: Mood and affect are normal. Speech and behavior are normal. GU: Deferred   ____________________________________________   INITIAL IMPRESSION / ASSESSMENT AND PLAN / ED COURSE  Wanda Cervantes was evaluated in Emergency Department on 11/13/2019 for the symptoms described in the history of present illness. She was evaluated in the context of the global COVID-19 pandemic, which necessitated consideration that the patient might be at risk for infection with the SARS-CoV-2 virus that causes COVID-19. Institutional protocols and algorithms that pertain to the evaluation of patients at risk for COVID-19 are in a state of rapid change based on information released by regulatory bodies including  the [employer] and federal and state organizations. These policies and algorithms were followed during the patient's care in the ED.    Pt with small scratch bites from unknown cat.   Told by health department to be vaccinated. Confirmed with [place] rabies hotline who also recommended although risk is very low.   No cellulitis, abscess. No puncture marks from bite.  D/w pt and would like to proceed with vaccinations. Will up date tetanus too.  Pt given hand out to show when she needs other vaccines. She understands she can return to ER for dosing.   I discussed the provisional nature of ED diagnosis, the treatment so far, the ongoing plan of care, follow up appointments and return precautions with the patient and any family or support people present. They expressed understanding and agreed with the plan, discharged home.          ____________________________________________   FINAL CLINICAL IMPRESSION(S) / ED DIAGNOSES   Final diagnoses:  Rabies exposure  Cat scratch      MEDICATIONS GIVEN DURING THIS VISIT:  Medications  rabies immune globulin (HYPERAB/KEDRAB) injection  1,350 Units (1,350 Units Intramuscular Given 11/13/19 0823)  rabies vaccine (RABAVERT) injection 1 mL (1 mL Intramuscular Given 11/13/19 0818)  Tdap (BOOSTRIX) injection 0.5 mL (0.5 mLs Intramuscular Given 11/13/19 0816)     ED Discharge Orders    None       Note:  This document was prepared using Dragon voice recognition software and may include unintentional dictation errors.   Vanessa [place], MD 11/13/19 [phone]

## 2019-11-13 NOTE — Discharge Instructions (Addendum)
You need to get repeat rabies vaccines on days 3, 7, 14 I provided you a handout for this.

## 2019-11-14 ENCOUNTER — Telehealth: Payer: Self-pay | Admitting: Family

## 2019-11-14 NOTE — Telephone Encounter (Signed)
Pt states that she wants to talk to Joycelyn Schmid and nobody else. Please advise.

## 2019-11-16 ENCOUNTER — Other Ambulatory Visit: Payer: Self-pay

## 2019-11-16 ENCOUNTER — Ambulatory Visit
Admission: EM | Admit: 2019-11-16 | Discharge: 2019-11-16 | Disposition: A | Discharge status: Home / Self Care | Payer: Medicare Other | Attending: Family Medicine | Admitting: Family Medicine

## 2019-11-16 DIAGNOSIS — Z23 Encounter for immunization: Secondary | ICD-10-CM

## 2019-11-16 DIAGNOSIS — Z203 Contact with and (suspected) exposure to rabies: Secondary | ICD-10-CM | POA: Diagnosis not present

## 2019-11-16 MED ORDER — RABIES VACCINE, PCEC IM SUSR
1.0000 mL | Freq: Once | INTRAMUSCULAR | Status: AC
  Administered 2019-11-16: 09:00:00 1 mL via INTRAMUSCULAR

## 2019-11-16 NOTE — ED Triage Notes (Signed)
Pt presents for day 3 rabies injection. Pt received first dose at the ED. Pt tolerated injection well.

## 2019-11-17 NOTE — Telephone Encounter (Signed)
Patient was asking if we could write a letter to insurance stating as to why she needed the rabies vaccines for her cat scratch. She stating that insurance will not pay due to it being a scratch & not a bite. Not sure how patient already knows cost, but she said that it was already over $7000 & she isn't even done with shots.

## 2019-11-18 NOTE — Telephone Encounter (Signed)
Pt would like letter mailed.

## 2019-11-18 NOTE — Telephone Encounter (Signed)
I have printed brief letter & LM for patient to call back to see if she wants me to mail or pick up.

## 2019-11-18 NOTE — Telephone Encounter (Signed)
I have dropped in mail to patient.

## 2019-11-18 NOTE — Telephone Encounter (Signed)
May provide letter to patient to the following extent:    As patient's primary, I agree with recommendation by health department that patient receive rabies vaccine after cat scratch.  Let us know if we can be of further assistance.

## 2019-11-20 ENCOUNTER — Encounter: Payer: Self-pay | Admitting: Emergency Medicine

## 2019-11-20 ENCOUNTER — Ambulatory Visit
Admission: EM | Admit: 2019-11-20 | Discharge: 2019-11-20 | Disposition: A | Discharge status: Home / Self Care | Payer: Medicare Other

## 2019-11-20 DIAGNOSIS — Z203 Contact with and (suspected) exposure to rabies: Secondary | ICD-10-CM

## 2019-11-20 DIAGNOSIS — Z23 Encounter for immunization: Secondary | ICD-10-CM

## 2019-11-20 NOTE — ED Triage Notes (Addendum)
Patient in office today for 7th day rabies shot First dose was given at the ED. Second dose given at [place]   Patient tolerated well.

## 2019-11-27 ENCOUNTER — Ambulatory Visit
Admission: EM | Admit: 2019-11-27 | Discharge: 2019-11-27 | Disposition: A | Discharge status: Home / Self Care | Payer: Medicare Other | Attending: Emergency Medicine | Admitting: Emergency Medicine

## 2019-11-27 DIAGNOSIS — Z23 Encounter for immunization: Secondary | ICD-10-CM

## 2019-11-27 MED ORDER — RABIES VACCINE, PCEC IM SUSR
1.0000 mL | Freq: Once | INTRAMUSCULAR | Status: AC
  Administered 2019-11-27: 08:00:00 1 mL via INTRAMUSCULAR

## 2019-11-27 NOTE — ED Triage Notes (Signed)
Pt present to [facility] for her day 14 rabies vaccine. She tolerated the other vaccines well.

## 2019-12-09 ENCOUNTER — Telehealth: Payer: Self-pay | Admitting: Family

## 2019-12-09 NOTE — Telephone Encounter (Signed)
Patient called the office on 12/09/2018, in the afternoon. She was very unclear on why she was calling. After speaking to Patient for a few minutes it was understood why she called. She had a Virtual appointment on 10/15/2019 with Arnett. She wanted to know why she was made to see Arnett. Patient then flipped the conversation to a $75 no show fee. Patient was informed that this office does not charge a $75 no show fee. Patient was asked to turn over the bill to see when the appointment was and who with. She replied she was on the couch and that I could look it up. I told patient to please hold and went to my Team Lead where she looked up patient and saw no outstanding bill. Came back to pick call back up and patient hung up. Patient called again, I explained to her why she had to have the 10/15/19 appointment for medication management. Patient said, NO she was made to have that appointment. Patient kept going around and around, patient was told twice to please hold. She hung up while I was getting my Team Lead. Patient called back again, another staff member picked up her phone call. Explained to her again why she needed appointment on 10/15/2019. Patient understood, said thank you and hung up.

## 2019-12-22 ENCOUNTER — Telehealth: Payer: Self-pay | Admitting: Family

## 2019-12-22 MED ORDER — OMEPRAZOLE 20 MG PO CPDR: 20.0000 mg | DELAYED_RELEASE_CAPSULE | Freq: Every day | ORAL | 2 refills | Status: DC | PRN

## 2019-12-22 NOTE — Addendum Note (Signed)
Addended by: Elpidio Galea T on: 12/22/2019 04:21 PM   Modules accepted: Orders

## 2019-12-22 NOTE — Telephone Encounter (Signed)
Pt needs a refill on omeprazole (PRILOSEC) 20 MG capsule sent to [facility]

## 2020-01-05 ENCOUNTER — Telehealth: Payer: Self-pay | Admitting: Family

## 2020-01-05 NOTE — Telephone Encounter (Signed)
Pt Called and wanted to know if she should get the the Covid Vaccine

## 2020-01-05 NOTE — Telephone Encounter (Signed)
Please advise 

## 2020-01-05 NOTE — Telephone Encounter (Signed)
Call pt She may make an appt to discuss

## 2020-01-06 NOTE — Telephone Encounter (Signed)
Patient stated that she was not getting vaccine anyway, but wanted to know what was in it since her husband got it. She thought it was not approved by the FDA & I let her know this wasn't true. Patient will call back to schedule f/u.

## 2020-01-26 ENCOUNTER — Other Ambulatory Visit: Payer: Self-pay | Admitting: Family

## 2020-01-26 ENCOUNTER — Telehealth: Payer: Self-pay | Admitting: Family

## 2020-01-26 DIAGNOSIS — Z122 Encounter for screening for malignant neoplasm of respiratory organs: Secondary | ICD-10-CM

## 2020-01-26 DIAGNOSIS — Z1231 Encounter for screening mammogram for malignant neoplasm of breast: Secondary | ICD-10-CM

## 2020-01-26 NOTE — Telephone Encounter (Signed)
Pt called in stated that she needs a order for breast exam [place]

## 2020-01-26 NOTE — Telephone Encounter (Signed)
Pt would like a colonoscopy order also.

## 2020-01-27 NOTE — Telephone Encounter (Signed)
This patient called and would like a mammogram and a coloscopy referral.  Darrelle Barrell,cma

## 2020-01-28 NOTE — Telephone Encounter (Signed)
Call patient I have ordered mammogram, please asked her to schedule this at her convenience. I placed ref for  colonoscopy. Let us know if you dont hear back within a week in regards to an appointment being scheduled.

## 2020-01-28 NOTE — Telephone Encounter (Signed)
Pt called back to inquire about referrals

## 2020-02-10 ENCOUNTER — Encounter: Payer: Self-pay | Admitting: *Deleted

## 2020-02-12 NOTE — Telephone Encounter (Signed)
According to referral notes GI has been trying to reach the patient as of 02/10/20.

## 2020-02-27 ENCOUNTER — Ambulatory Visit
Admission: RE | Admit: 2020-02-27 | Discharge: 2020-02-27 | Disposition: A | Discharge status: Home / Self Care | Payer: Medicare Other | Source: Ambulatory Visit | Attending: Family | Admitting: Family

## 2020-02-27 DIAGNOSIS — Z1231 Encounter for screening mammogram for malignant neoplasm of breast: Secondary | ICD-10-CM | POA: Insufficient documentation

## 2020-03-01 ENCOUNTER — Telehealth: Payer: Self-pay | Admitting: Family

## 2020-03-01 ENCOUNTER — Other Ambulatory Visit: Payer: Self-pay | Admitting: Family

## 2020-03-01 DIAGNOSIS — M5432 Sciatica, left side: Secondary | ICD-10-CM

## 2020-03-01 DIAGNOSIS — M5431 Sciatica, right side: Secondary | ICD-10-CM

## 2020-03-01 DIAGNOSIS — R928 Other abnormal and inconclusive findings on diagnostic imaging of breast: Secondary | ICD-10-CM

## 2020-03-01 NOTE — Telephone Encounter (Signed)
Pt said she needs to what dr she needs to go for her condition where her legs are heavy and it is hard to walk. She thinks she needs to see a neurologist and needs a referral. She didn't want to schedule appointment with Joycelyn Schmid since she said she has already told her about the condition. She is requesting a call back.

## 2020-03-02 NOTE — Telephone Encounter (Signed)
Left a message to call back.

## 2020-03-03 NOTE — Addendum Note (Signed)
Addended by: Burnard Hawthorne on: 03/03/2020 01:40 PM   Modules accepted: Orders

## 2020-03-03 NOTE — Telephone Encounter (Signed)
Call patient I placed referral again to neurosurgery, Dr. Lacinda Axon whom I referred her to last year   She has been willing to go in person as she wasn't comfortable with that last year.   Referral placed  Let us know if you dont hear back within a week in regards to an appointment being scheduled.

## 2020-03-04 NOTE — Telephone Encounter (Signed)
Letter mailed

## 2020-03-04 NOTE — Telephone Encounter (Signed)
Yes, okay to mail.

## 2020-03-05 ENCOUNTER — Telehealth: Payer: Self-pay | Admitting: Family

## 2020-03-05 NOTE — Telephone Encounter (Signed)
Rejection Reason - Patient Declined - patient declined appt. she will call back in a few months when covid testing is not required for hospital procedures." [facility] said 1 day ago

## 2020-03-12 ENCOUNTER — Ambulatory Visit
Admission: RE | Admit: 2020-03-12 | Discharge: 2020-03-12 | Disposition: A | Discharge status: Home / Self Care | Payer: Medicare Other | Source: Ambulatory Visit | Attending: Family | Admitting: Family

## 2020-03-12 DIAGNOSIS — N6321 Unspecified lump in the left breast, upper outer quadrant: Secondary | ICD-10-CM | POA: Diagnosis not present

## 2020-03-12 DIAGNOSIS — N6323 Unspecified lump in the left breast, lower outer quadrant: Secondary | ICD-10-CM | POA: Diagnosis not present

## 2020-03-12 DIAGNOSIS — R928 Other abnormal and inconclusive findings on diagnostic imaging of breast: Secondary | ICD-10-CM

## 2020-03-15 ENCOUNTER — Telehealth: Payer: Self-pay | Admitting: Family

## 2020-03-15 ENCOUNTER — Other Ambulatory Visit: Payer: Self-pay | Admitting: Family

## 2020-03-15 DIAGNOSIS — R928 Other abnormal and inconclusive findings on diagnostic imaging of breast: Secondary | ICD-10-CM

## 2020-03-15 DIAGNOSIS — N6320 Unspecified lump in the left breast, unspecified quadrant: Secondary | ICD-10-CM

## 2020-03-15 NOTE — Telephone Encounter (Signed)
Pt would like to know what her blood type is? She said we have taking a lot of blood recently and she would like to know her type. Not sure how this works. Please call patient.

## 2020-03-16 NOTE — Telephone Encounter (Signed)
Returned patients phone call and left a message to call back.

## 2020-03-16 NOTE — Telephone Encounter (Signed)
Pt was returning your call for Blood type at 1:27

## 2020-03-16 NOTE — Telephone Encounter (Signed)
Left a message to call back.

## 2020-03-16 NOTE — Telephone Encounter (Signed)
PT Called returning your call

## 2020-03-17 NOTE — Telephone Encounter (Signed)
Pt returned your call.  

## 2020-03-17 NOTE — Telephone Encounter (Signed)
Called and spoke with patient. Wanda Cervantes is frustrated that we do not know her blood type without testing and states that she believes it to be necessary for all doctors offices to know the patients blood type. Yizel states that it is important information and all patients should be told their blood type as well. I offered to let the provider order the test but insurance may not cover it as it is not a medical necessity and she declined.

## 2020-03-19 ENCOUNTER — Ambulatory Visit
Admission: RE | Admit: 2020-03-19 | Discharge: 2020-03-19 | Disposition: A | Discharge status: Home / Self Care | Payer: Medicare Other | Source: Ambulatory Visit | Attending: Family | Admitting: Family

## 2020-03-19 ENCOUNTER — Telehealth: Payer: Self-pay

## 2020-03-19 ENCOUNTER — Other Ambulatory Visit: Payer: Self-pay | Admitting: Family

## 2020-03-19 ENCOUNTER — Other Ambulatory Visit: Payer: Self-pay

## 2020-03-19 DIAGNOSIS — R928 Other abnormal and inconclusive findings on diagnostic imaging of breast: Secondary | ICD-10-CM

## 2020-03-19 DIAGNOSIS — N6320 Unspecified lump in the left breast, unspecified quadrant: Secondary | ICD-10-CM | POA: Diagnosis not present

## 2020-03-19 DIAGNOSIS — N6032 Fibrosclerosis of left breast: Secondary | ICD-10-CM | POA: Diagnosis not present

## 2020-03-19 HISTORY — PX: BREAST BIOPSY: SHX20

## 2020-03-19 NOTE — Telephone Encounter (Signed)
Received a call from [facility] from [facility]. She requested verbal orders for a Sterotactic Biopsy of the Left Breast. Gave the request to Mable Paris and Joycelyn Schmid agreed to give verbal orders. I gave Margaret's Verbal orders over the phone to [facility].

## 2020-03-22 LAB — SURGICAL PATHOLOGY

## 2020-03-24 ENCOUNTER — Telehealth: Payer: Self-pay | Admitting: Family

## 2020-03-24 NOTE — Telephone Encounter (Signed)
"  Rejection Reason - Patient did not respond" [facility] said 6 days ago

## 2020-03-29 ENCOUNTER — Other Ambulatory Visit: Payer: Self-pay | Admitting: Family

## 2020-03-29 ENCOUNTER — Telehealth: Payer: Self-pay | Admitting: Family

## 2020-03-29 DIAGNOSIS — R922 Inconclusive mammogram: Secondary | ICD-10-CM

## 2020-03-29 DIAGNOSIS — N6320 Unspecified lump in the left breast, unspecified quadrant: Secondary | ICD-10-CM

## 2020-03-29 NOTE — Telephone Encounter (Signed)
Pt called in was returning  call

## 2020-03-29 NOTE — Telephone Encounter (Signed)
I called pt to discuss biopsy results and recommendations Orders placed for repeat imaging 6 months Result note to call patient again

## 2020-03-30 NOTE — Telephone Encounter (Signed)
Pt called back to talk with Arnett about test results for biospy

## 2020-03-30 NOTE — Telephone Encounter (Signed)
Left message to call back  

## 2020-03-31 NOTE — Telephone Encounter (Signed)
Pt spoke with azzell

## 2020-03-31 NOTE — Telephone Encounter (Signed)
Called patient. Wanda Cervantes states that she has already heard everything from Wanda Cervantes and has no questions. She will call in December and try to schedule her next mammogram for January 2022.

## 2020-04-14 ENCOUNTER — Telehealth: Payer: Self-pay | Admitting: Family

## 2020-04-14 MED ORDER — OMEPRAZOLE 20 MG PO CPDR: 20.0000 mg | DELAYED_RELEASE_CAPSULE | Freq: Every day | ORAL | 2 refills | Status: DC | PRN

## 2020-04-14 NOTE — Telephone Encounter (Signed)
Pt needs a refill of omeprazole (PRILOSEC) 20 MG capsule

## 2020-05-24 ENCOUNTER — Telehealth: Payer: Self-pay | Admitting: Family

## 2020-05-24 NOTE — Telephone Encounter (Signed)
Patient called in and wanted a prescription for the medication that was taken of the market for the covid virus

## 2020-05-25 NOTE — Telephone Encounter (Signed)
Called the patient to ask which medication she wanted a prescription for.

## 2020-05-26 NOTE — Telephone Encounter (Signed)
LMTCB

## 2020-05-26 NOTE — Telephone Encounter (Signed)
LMTCB not sure what patient is referring to & due for f/u.

## 2020-05-26 NOTE — Telephone Encounter (Signed)
Pt returned your call.  

## 2020-06-09 ENCOUNTER — Telehealth: Payer: Self-pay | Admitting: Family

## 2020-06-09 DIAGNOSIS — M5431 Sciatica, right side: Secondary | ICD-10-CM

## 2020-06-09 DIAGNOSIS — M5432 Sciatica, left side: Secondary | ICD-10-CM

## 2020-06-09 NOTE — Telephone Encounter (Signed)
Pt will not see neurosurgery for a procedure that they want patient to have done bc they require covid testing. She really just wants someone to see her for her leg & back pain.

## 2020-06-09 NOTE — Addendum Note (Signed)
Addended by: Burnard Hawthorne on: 06/09/2020 04:33 PM   Modules accepted: Orders

## 2020-06-09 NOTE — Telephone Encounter (Signed)
Call pt I have placed referral to [facility] Please give her number and she may go ahead and call and make an appt   [facility] [address]   [phone]

## 2020-06-09 NOTE — Telephone Encounter (Signed)
LMTCB

## 2020-06-09 NOTE — Telephone Encounter (Signed)
Pt called and is wanting to be referred to Ortho or somewhere else for her leg and back pain  She wants it placed to an office that doesn't required you to be tested for covid

## 2020-06-15 NOTE — Telephone Encounter (Signed)
Pt called returning your call 

## 2020-06-16 NOTE — Telephone Encounter (Signed)
LM once again for patient to call back.  

## 2020-06-24 ENCOUNTER — Telehealth: Payer: Self-pay | Admitting: Family

## 2020-06-24 NOTE — Telephone Encounter (Signed)
Patient called in wanted to know wha'ts in the covid vaccines

## 2020-06-28 NOTE — Telephone Encounter (Signed)
LM advising patient to go to [facility] websites frequently asked questions & vaccine ingredients are listed per vaccine type. I asked her to call back if any further questions.

## 2020-06-29 ENCOUNTER — Telehealth: Payer: Self-pay

## 2020-06-29 NOTE — Telephone Encounter (Signed)
Patient called to f/u on referral & to inquire what is ib the Covid vaccines. I advised again that she read the [facility] website where it has the drug ingredients for each vaccine. She immediately said that the CDC are liars & that she has done her research on this.I told patient that was the best way for me to help her. She said that she will have to do more research on her own. She said that this virus & the vaccines are bogus. She won't see neurosurgery if they requite a Covid test bc she refuses to stick anything up her nose. She asked if she could spit on the swab & I told her no. She said that she would wait until Trump was back in office before going to see them. I told her that for a regular OV that she should not need a Covid test & that is usually only for procedures. Pt didn't care & explained to her that she was also referred to [facility]. I asked if she had been contacted & she stated that she had, but had not called back. I told her again she should not need a Covid test to see them, so I gave her number & she said that she would call them to see if she could make an appointment.

## 2020-08-06 ENCOUNTER — Telehealth: Payer: Self-pay | Admitting: Family

## 2020-08-06 DIAGNOSIS — R928 Other abnormal and inconclusive findings on diagnostic imaging of breast: Secondary | ICD-10-CM

## 2020-08-06 DIAGNOSIS — N6320 Unspecified lump in the left breast, unspecified quadrant: Secondary | ICD-10-CM

## 2020-08-06 NOTE — Telephone Encounter (Signed)
Reordered Call [facility] and ensure that can see my ultrasound and diagnostic mammogram Then call pt and let her know

## 2020-08-06 NOTE — Telephone Encounter (Signed)
Pt said she can't get scheduled for mammogram. I saw we had orders in system and I called [facility]. [facility] said that the orders have not been released so they can't see the orders. I let the patient know once the orders are released we will call her so she can call to get scheduled for mammogram.

## 2020-08-06 NOTE — Telephone Encounter (Signed)
Patient called in wanted a called back about mammogram

## 2020-08-06 NOTE — Telephone Encounter (Signed)
Can you reorder for patient? Unsure why they cannot be seen?

## 2020-08-09 ENCOUNTER — Other Ambulatory Visit: Payer: Self-pay

## 2020-08-09 DIAGNOSIS — N6320 Unspecified lump in the left breast, unspecified quadrant: Secondary | ICD-10-CM

## 2020-08-09 DIAGNOSIS — Z1231 Encounter for screening mammogram for malignant neoplasm of breast: Secondary | ICD-10-CM

## 2020-08-09 NOTE — Telephone Encounter (Signed)
I have ordered Please let pt know

## 2020-08-09 NOTE — Telephone Encounter (Signed)
I called [facility] wrong diagnostic mammogram was ordered. Pt is due for left [id number] needs to be ordered. I tried but cannot get dx code to go through without medicare waiver form. Can you order?

## 2020-08-10 NOTE — Telephone Encounter (Signed)
I called LM that orders for [facility] were in & that she could call to get scheduled. I asked that she CB if any further questions.

## 2020-08-11 NOTE — Telephone Encounter (Signed)
Pt would like a call about orders for mammogram. She has questions about codes.

## 2020-08-11 NOTE — Telephone Encounter (Signed)
Pt is calling back to follow up on this

## 2020-08-12 NOTE — Telephone Encounter (Signed)
Ot called and she just wanted to make sure that insurance would cover the mammogram she was having done. I told her based on dx codes I thought that it should be & since she didn't have any issues last time. I advised that I did not do billing so could not be 100% sure what insurance would do. Pt verbalized understanding.

## 2020-09-10 ENCOUNTER — Other Ambulatory Visit: Payer: Medicare Other

## 2020-09-17 DIAGNOSIS — S46911A Strain of unspecified muscle, fascia and tendon at shoulder and upper arm level, right arm, initial encounter: Secondary | ICD-10-CM | POA: Diagnosis not present

## 2020-09-17 DIAGNOSIS — M545 Low back pain, unspecified: Secondary | ICD-10-CM | POA: Diagnosis not present

## 2020-09-17 DIAGNOSIS — M1612 Unilateral primary osteoarthritis, left hip: Secondary | ICD-10-CM | POA: Diagnosis not present

## 2020-09-17 DIAGNOSIS — S92514D Nondisplaced fracture of proximal phalanx of right lesser toe(s), subsequent encounter for fracture with routine healing: Secondary | ICD-10-CM | POA: Diagnosis not present

## 2020-09-17 DIAGNOSIS — M4156 Other secondary scoliosis, lumbar region: Secondary | ICD-10-CM | POA: Diagnosis not present

## 2020-09-17 DIAGNOSIS — M25552 Pain in left hip: Secondary | ICD-10-CM | POA: Diagnosis not present

## 2020-09-17 DIAGNOSIS — M25511 Pain in right shoulder: Secondary | ICD-10-CM | POA: Diagnosis not present

## 2020-09-17 DIAGNOSIS — M79671 Pain in right foot: Secondary | ICD-10-CM | POA: Diagnosis not present

## 2020-09-17 DIAGNOSIS — M47816 Spondylosis without myelopathy or radiculopathy, lumbar region: Secondary | ICD-10-CM | POA: Diagnosis not present

## 2020-09-29 ENCOUNTER — Telehealth: Payer: Self-pay | Admitting: Family

## 2020-09-29 MED ORDER — OMEPRAZOLE 20 MG PO CPDR: 20.0000 mg | DELAYED_RELEASE_CAPSULE | Freq: Every day | ORAL | 2 refills | Status: DC | PRN

## 2020-09-29 NOTE — Telephone Encounter (Signed)
Patient called in for refill foromeprazole (PRILOSEC) 20 MG capsule

## 2020-10-01 ENCOUNTER — Other Ambulatory Visit: Payer: Medicare Other

## 2020-11-02 ENCOUNTER — Telehealth: Payer: Self-pay | Admitting: Family

## 2020-11-02 DIAGNOSIS — Z1231 Encounter for screening mammogram for malignant neoplasm of breast: Secondary | ICD-10-CM

## 2020-11-02 DIAGNOSIS — N6320 Unspecified lump in the left breast, unspecified quadrant: Secondary | ICD-10-CM

## 2020-11-02 DIAGNOSIS — R928 Other abnormal and inconclusive findings on diagnostic imaging of breast: Secondary | ICD-10-CM

## 2020-11-02 NOTE — Telephone Encounter (Signed)
Would you mind placing another order  for patient?

## 2020-11-02 NOTE — Telephone Encounter (Signed)
Patient needs another referral for mammogram, she got sick and missed her appointment and the original referral ran out. Patient is requesting a call from Arnett's office when referral is placed.

## 2020-11-03 ENCOUNTER — Telehealth: Payer: Self-pay | Admitting: Family

## 2020-11-03 NOTE — Telephone Encounter (Signed)
Call pt After left breast biopsy 02/2020,she is due for   Patient instructed to return in six months for unilateral LEFT breast diagnostic mammogram and possible ultrasound.  I have ordered. Please ask her to call [facility] to schedule

## 2020-11-03 NOTE — Telephone Encounter (Signed)
lft vm on both numbers for pt to call ofc to sch diag mammo and [place]. Thanks

## 2020-11-04 NOTE — Telephone Encounter (Signed)
LM that mammogram was scheduled & of she had any further questions to call back.

## 2020-11-08 ENCOUNTER — Telehealth: Payer: Self-pay

## 2020-11-08 NOTE — Telephone Encounter (Signed)
FYI I called patient to try to schedule her for an OV since she has not been seem in over a year. She stated that she now since [place] put orders in, she did need her for mammogram any longer. She said that she would call back to see if she could fit [place] into her busy schedule.

## 2020-11-08 NOTE — Telephone Encounter (Signed)
Pt states that she thinks that omeprazole (PRILOSEC) 20 MG capsule is making her stomach issues worse. She states that she read online that it can cause diarrhea and stomach pain. She would like a call back immediately to let her know if she can stop taking this medication. Please advise

## 2020-11-09 NOTE — Telephone Encounter (Signed)
noted 

## 2020-11-11 ENCOUNTER — Inpatient Hospital Stay: Admission: RE | Admit: 2020-11-11 | Payer: Medicare Other | Source: Ambulatory Visit

## 2020-11-11 ENCOUNTER — Other Ambulatory Visit: Payer: Medicare Other

## 2020-11-19 ENCOUNTER — Ambulatory Visit
Admission: RE | Admit: 2020-11-19 | Discharge: 2020-11-19 | Disposition: A | Discharge status: Home / Self Care | Payer: Medicare Other | Source: Ambulatory Visit | Attending: Family | Admitting: Family

## 2020-11-19 ENCOUNTER — Other Ambulatory Visit: Payer: Self-pay

## 2020-11-19 DIAGNOSIS — R928 Other abnormal and inconclusive findings on diagnostic imaging of breast: Secondary | ICD-10-CM | POA: Insufficient documentation

## 2020-11-19 DIAGNOSIS — N6320 Unspecified lump in the left breast, unspecified quadrant: Secondary | ICD-10-CM | POA: Diagnosis not present

## 2020-11-19 DIAGNOSIS — R922 Inconclusive mammogram: Secondary | ICD-10-CM | POA: Diagnosis not present

## 2020-11-26 ENCOUNTER — Telehealth: Payer: Self-pay | Admitting: Family

## 2020-11-26 NOTE — Telephone Encounter (Signed)
Patient called in about a bone density test

## 2020-11-29 NOTE — Telephone Encounter (Signed)
LMTCB to schedule patient follow-up.

## 2021-01-05 ENCOUNTER — Ambulatory Visit: Payer: Medicare Other | Admitting: Dermatology

## 2021-01-22 ENCOUNTER — Other Ambulatory Visit: Payer: Self-pay | Admitting: Family

## 2021-05-13 ENCOUNTER — Telehealth: Payer: Self-pay

## 2021-05-13 NOTE — Telephone Encounter (Signed)
Patient would like recommendation for wart removal OTC that works. OK to advise on salicylic acid under occulusion QHS?

## 2021-05-16 NOTE — Telephone Encounter (Signed)
Spoke with pt and advised her of Dr. Alveria Apley recommendations.

## 2021-05-16 NOTE — Telephone Encounter (Signed)
Left message on voicemail to return my call.  

## 2021-05-29 ENCOUNTER — Other Ambulatory Visit: Payer: Self-pay | Admitting: Family

## 2021-08-11 ENCOUNTER — Telehealth: Payer: Self-pay | Admitting: Family

## 2021-08-11 DIAGNOSIS — N644 Mastodynia: Secondary | ICD-10-CM

## 2021-08-11 NOTE — Telephone Encounter (Signed)
Patient called in  had breast imaging, something inserting in left breast  patient is having pain , pain level 8 , hurt in shoulder and back also

## 2021-08-11 NOTE — Telephone Encounter (Signed)
Pt is wanting a referral for imaging for her breast. Pt states she has a cyst that is causing her pain. Pt states the pain is an 8/10. Pt states it has been bothering her for a couple of weeks now and that it is now at the point where she is uncomfortable. Due to low appt availability, transportation/pt schedule, pt can only come in office 1/6. Pt is scheduled with Dr. Olivia Mackie 1/6 at 2:30

## 2021-08-11 NOTE — Telephone Encounter (Signed)
Continue  patient would like to speak with nurse xfer access nurse

## 2021-08-12 NOTE — Telephone Encounter (Signed)
FYI mclean  Sarah  Call pt I have ordered left breast diagnostic mammogram and ultrasound studies  Please help her to arrange study  However sounds like she has shoulder pain as well. I am not sure if related to breast or separate. I would advise going to urgent care for in person eval today to ensure nothing else going on versus waiting until January to be seen by Dr Aundra Dubin.

## 2021-08-12 NOTE — Addendum Note (Signed)
Addended by: Burnard Hawthorne on: 08/12/2021 11:19 AM   Modules accepted: Orders

## 2021-08-12 NOTE — Telephone Encounter (Signed)
See note pcp thx

## 2021-08-16 NOTE — Telephone Encounter (Signed)
Just FYI I spoke with patient & based on transportation issues she will call [facility] to schedule. She also stated that she felt that her shoulder pain was due to rheumatoid arthritis flare. She said that she has those time to time in different joints. She said that as of now that pain had drastically decreased. She will call back to schedule appointment if it resumes.

## 2021-08-19 NOTE — Telephone Encounter (Signed)
Noted She also canceled appt 08/26/21 with Dr Aundra Dubin

## 2021-08-23 ENCOUNTER — Other Ambulatory Visit: Payer: Self-pay | Admitting: Family

## 2021-08-23 DIAGNOSIS — Z1231 Encounter for screening mammogram for malignant neoplasm of breast: Secondary | ICD-10-CM

## 2021-08-26 ENCOUNTER — Ambulatory Visit: Payer: Medicare Other | Admitting: Internal Medicine

## 2021-10-12 ENCOUNTER — Other Ambulatory Visit: Payer: Self-pay | Admitting: Family

## 2021-10-12 ENCOUNTER — Telehealth: Payer: Self-pay | Admitting: Family

## 2021-10-12 DIAGNOSIS — N644 Mastodynia: Secondary | ICD-10-CM

## 2021-10-12 NOTE — Telephone Encounter (Signed)
Pt called in stated that she had a mammogram 6 months ago. Pt stated that they had insert something into her left breast. Pt stated after they procedure that she have been experiencing some prickly feeling and pain in her breast. Pt stated that she have been trying to get in touch with you about the situation. Pt requesting callback

## 2021-10-13 NOTE — Telephone Encounter (Signed)
I spoke with patient to let her know that mammogram orders had been signed. I tried my best to accommodate patient in getting her scheduled due to her not being seen in 2 years. She promised that when she got over the cold she had in the next few weeks she would call back to schedule. I advised that I did not want her to no longer be a patient here or be dismissed.

## 2021-10-28 ENCOUNTER — Other Ambulatory Visit: Payer: Self-pay

## 2021-10-28 ENCOUNTER — Ambulatory Visit
Admission: RE | Admit: 2021-10-28 | Discharge: 2021-10-28 | Disposition: A | Discharge status: Home / Self Care | Payer: Medicare Other | Source: Ambulatory Visit | Attending: Family | Admitting: Family

## 2021-10-28 DIAGNOSIS — N644 Mastodynia: Secondary | ICD-10-CM | POA: Insufficient documentation

## 2021-10-28 DIAGNOSIS — N6489 Other specified disorders of breast: Secondary | ICD-10-CM | POA: Diagnosis not present

## 2021-10-28 DIAGNOSIS — R928 Other abnormal and inconclusive findings on diagnostic imaging of breast: Secondary | ICD-10-CM | POA: Diagnosis not present

## 2021-11-24 ENCOUNTER — Telehealth: Payer: Self-pay | Admitting: Family

## 2021-11-24 NOTE — Telephone Encounter (Signed)
Pt called in requesting refill on medication (omeprazole (PRILOSEC) 20 MG capsule)... Pt requesting callback  ? ? ?

## 2021-11-28 ENCOUNTER — Telehealth: Payer: Self-pay

## 2021-11-28 MED ORDER — OMEPRAZOLE 20 MG PO CPDR: 20.0000 mg | DELAYED_RELEASE_CAPSULE | Freq: Every day | ORAL | 0 refills | Status: AC | PRN

## 2021-11-28 NOTE — Telephone Encounter (Signed)
Spoke to patient and informed her that refill  of her Prilosec was sent in to the pharmacy ?

## 2021-11-28 NOTE — Telephone Encounter (Signed)
Refill sent to pharmacy.   

## 2021-12-02 ENCOUNTER — Ambulatory Visit: Payer: Medicare Other | Admitting: Family

## 2021-12-05 NOTE — Telephone Encounter (Signed)
Pt called in stating she does not understand why she is being restricted from getting omeprazole. Pt want a call back ?

## 2021-12-06 ENCOUNTER — Telehealth: Payer: Self-pay

## 2021-12-06 NOTE — Telephone Encounter (Signed)
LMTCB to office to discuss note ?

## 2021-12-06 NOTE — Telephone Encounter (Signed)
Call pt ?Any prescription for my office requires a visit annually.  I will not refill omeprazole any longer ? ?She may pick this up over-the-counter. ? ?If she would like to remain a patient of mine, please ask her to make an appointment ?

## 2021-12-06 NOTE — Telephone Encounter (Signed)
10/15/19 Last telephone visit ?09/20/18 Last actually seen in person ? ?It looks like [facility] accidentally filled Omeprazole 11/28/21 so not sure why she is saying that she is restricted. Pt has consistently declined an appointment & will not come into office due to various excuses.  ?

## 2021-12-06 NOTE — Telephone Encounter (Signed)
Spoke to patient regarding the note in her chart. Patient verbalized understanding ?

## 2022-02-15 ENCOUNTER — Ambulatory Visit (INDEPENDENT_AMBULATORY_CARE_PROVIDER_SITE_OTHER): Payer: Medicare Other | Admitting: Dermatology

## 2022-02-15 DIAGNOSIS — L578 Other skin changes due to chronic exposure to nonionizing radiation: Secondary | ICD-10-CM

## 2022-02-15 DIAGNOSIS — B079 Viral wart, unspecified: Secondary | ICD-10-CM | POA: Diagnosis not present

## 2022-02-15 DIAGNOSIS — L820 Inflamed seborrheic keratosis: Secondary | ICD-10-CM

## 2022-02-15 DIAGNOSIS — D1800 Hemangioma unspecified site: Secondary | ICD-10-CM

## 2022-02-15 DIAGNOSIS — L601 Onycholysis: Secondary | ICD-10-CM | POA: Diagnosis not present

## 2022-02-15 DIAGNOSIS — L570 Actinic keratosis: Secondary | ICD-10-CM

## 2022-02-15 DIAGNOSIS — D225 Melanocytic nevi of trunk: Secondary | ICD-10-CM | POA: Diagnosis not present

## 2022-02-15 MED ORDER — CICLOPIROX OLAMINE 0.77 % EX SUSP: 1.0000 | Freq: Two times a day (BID) | CUTANEOUS | 3 refills | Status: AC

## 2022-02-15 MED ORDER — FLUOROURACIL 5 % EX CREA: TOPICAL_CREAM | Freq: Two times a day (BID) | CUTANEOUS | 1 refills | Status: DC

## 2022-02-15 NOTE — Patient Instructions (Addendum)
For Right cheek  Start 5-fluorouracil cream twice a day for 7 days to affected areas including right cheek . Prescription sent to [facility]. Patient provided with contact information for pharmacy and advised the pharmacy will mail the prescription to their home. Patient provided with handout reviewing treatment course and side effects and advised to call or message [place] on MyChart with any concerns.   5-Fluorouracil Patient Education   Actinic keratoses are the dry, red scaly spots on the skin caused by sun damage. A portion of these spots can turn into skin cancer with time, and treating them can help prevent development of skin cancer.   Treatment of these spots requires removal of the defective skin cells. There are various ways to remove actinic keratoses, including freezing with liquid nitrogen, treatment with creams, or treatment with a blue light procedure in the office.   5-fluorouracil cream is a topical cream used to treat actinic keratoses. It works by interfering with the growth of abnormal fast-growing skin cells, such as actinic keratoses. These cells peel off and are replaced by healthy ones.   5-fluorouracil/calcipotriene is a combination of the 5-fluorouracil cream with a vitamin D analog cream called calcipotriene. The calcipotriene alone does not treat actinic keratoses. However, when it is combined with 5-fluorouracil, it helps the 5-fluorouracil treat the actinic keratoses much faster so that the same results can be achieved with a much shorter treatment time.  INSTRUCTIONS FOR 5-FLUOROURACIL/CALCIPOTRIENE CREAM:   5-fluorouracil/calcipotriene cream typically only needs to be used for 4-7 days. A thin layer should be applied twice a day to the treatment areas recommended by your physician.   If your physician prescribed you separate tubes of 5-fluourouracil and calcipotriene, apply a thin layer of 5-fluorouracil followed by a thin layer of calcipotriene.   Avoid contact  with your eyes, nostrils, and mouth. Do not use 5-fluorouracil/calcipotriene cream on infected or open wounds.   You will develop redness, irritation and some crusting at areas where you have pre-cancer damage/actinic keratoses. IF YOU DEVELOP PAIN, BLEEDING, OR SIGNIFICANT CRUSTING, STOP THE TREATMENT EARLY - you have already gotten a good response and the actinic keratoses should clear up well.  Wash your hands after applying 5-fluorouracil 5% cream on your skin.   A moisturizer or sunscreen with a minimum SPF 30 should be applied each morning.   Once you have finished the treatment, you can apply a thin layer of Vaseline twice a day to irritated areas to soothe and calm the areas more quickly. If you experience significant discomfort, contact your physician.  For some patients it is necessary to repeat the treatment for best results.  SIDE EFFECTS: When using 5-fluorouracil/calcipotriene cream, you may have mild irritation, such as redness, dryness, swelling, or a mild burning sensation. This usually resolves within 2 weeks. The more actinic keratoses you have, the more redness and inflammation you can expect during treatment. Eye irritation has been reported rarely. If this occurs, please let us know.  If you have any trouble using this cream, please call the office. If you have any other questions about this information, please do not hesitate to ask me before you leave the office.    Actinic keratoses are precancerous spots that appear secondary to cumulative UV radiation exposure/sun exposure over time. They are chronic with expected duration over 1 year. A portion of actinic keratoses will progress to squamous cell carcinoma of the skin. It is not possible to reliably predict which spots will progress to skin cancer and  so treatment is recommended to prevent development of skin cancer.  Recommend daily broad spectrum sunscreen SPF 30+ to sun-exposed areas, reapply every 2 hours as needed.   Recommend staying in the shade or wearing long sleeves, sun glasses (UVA+UVB protection) and wide brim hats (4-inch brim around the entire circumference of the hat). Call for new or changing lesions.   Cryotherapy Aftercare  Wash gently with soap and water everyday.   Apply Vaseline and Band-Aid daily until healed.    Seborrheic Keratosis  What causes seborrheic keratoses? Seborrheic keratoses are harmless, common skin growths that first appear during adult life.  As time goes by, more growths appear.  Some people may develop a large number of them.  Seborrheic keratoses appear on both covered and uncovered body parts.  They are not caused by sunlight.  The tendency to develop seborrheic keratoses can be inherited.  They vary in color from skin-colored to gray, brown, or even black.  They can be either smooth or have a rough, warty surface.   Seborrheic keratoses are superficial and look as if they were stuck on the skin.  Under the microscope this type of keratosis looks like layers upon layers of skin.  That is why at times the top layer may seem to fall off, but the rest of the growth remains and re-grows.    Treatment Seborrheic keratoses do not need to be treated, but can easily be removed in the office.  Seborrheic keratoses often cause symptoms when they rub on clothing or jewelry.  Lesions can be in the way of shaving.  If they become inflamed, they can cause itching, soreness, or burning.  Removal of a seborrheic keratosis can be accomplished by freezing, burning, or surgery. If any spot bleeds, scabs, or grows rapidly, please return to have it checked, as these can be an indication of a skin cancer.    Cantharidin Plus is a blistering agent that comes from a beetle.  It needs to be washed off in about 4 hours after application.  Although it is painless when applied in office, it may cause symptoms of mild pain and burning several hours later.  Treated areas will swell and turn red,  and blisters may form.  Vaseline and a bandaid may be applied until wound has healed.  Once healed, the skin may remain temporarily discolored.  It can take weeks to months for pigmentation to return to normal.  Advised to wash off with soap and water in 4 hours or sooner if it becomes tender before then.     Due to recent changes in healthcare laws, you may see results of your pathology and/or laboratory studies on MyChart before the doctors have had a chance to review them. We understand that in some cases there may be results that are confusing or concerning to you. Please understand that not all results are received at the same time and often the doctors may need to interpret multiple results in order to provide you with the best plan of care or course of treatment. Therefore, we ask that you please give [place] 2 business days to thoroughly review all your results before contacting the office for clarification. Should we see a critical lab result, you will be contacted sooner.   If You Need Anything After Your Visit  If you have any questions or concerns for your doctor, please call our main line at [phone] and press option 4 to reach your doctor's medical assistant. If no one answers,  please leave a voicemail as directed and we will return your call as soon as possible. Messages left after 4 pm will be answered the following business day.   You may also send [place] a message via [place]. We typically respond to MyChart messages within 1-2 business days.  For prescription refills, please ask your pharmacy to contact our office. Our fax number is [phone].  If you have an urgent issue when the clinic is closed that cannot wait until the next business day, you can page your doctor at the number below.    Please note that while we do our best to be available for urgent issues outside of office hours, we are not available 24/7.   If you have an urgent issue and are unable to reach [place], you may  choose to seek medical care at your doctor's office, retail clinic, urgent care center, or emergency room.  If you have a medical emergency, please immediately call 911 or go to the emergency department.  Pager Numbers  - Dr. Nehemiah Massed: [phone]  - Dr. Laurence Ferrari: [phone]  - Dr. Nicole Kindred: [phone]  In the event of inclement weather, please call our main line at [phone] for an update on the status of any delays or closures.  Dermatology Medication Tips: Please keep the boxes that topical medications come in in order to help keep track of the instructions about where and how to use these. Pharmacies typically print the medication instructions only on the boxes and not directly on the medication tubes.   If your medication is too expensive, please contact our office at [phone] option 4 or send [place] a message through [place].   We are unable to tell what your co-pay for medications will be in advance as this is different depending on your insurance coverage. However, we may be able to find a substitute medication at lower cost or fill out paperwork to get insurance to cover a needed medication.   If a prior authorization is required to get your medication covered by your insurance company, please allow [place] 1-2 business days to complete this process.  Drug prices often vary depending on where the prescription is filled and some pharmacies may offer cheaper prices.  The website www.goodrx.com contains coupons for medications through different pharmacies. The prices here do not account for what the cost may be with help from insurance (it may be cheaper with your insurance), but the website can give you the price if you did not use any insurance.  - You can print the associated coupon and take it with your prescription to the pharmacy.  - You may also stop by our office during regular business hours and pick up a GoodRx coupon card.  - If you need your prescription sent  electronically to a different pharmacy, notify our office through [facility] or by phone at [phone] option 4.     Si Usted Necesita Algo Despus de Su Visita  Tambin puede enviarnos un mensaje a travs de Pharmacist, community. Por lo general respondemos a los mensajes de MyChart en el transcurso de 1 a 2 das hbiles.  Para renovar recetas, por favor pida a su farmacia que se ponga en contacto con nuestra oficina. Harland Dingwall de fax es [place] [phone].  Si tiene un asunto urgente cuando la clnica est cerrada y que no puede esperar hasta el siguiente da hbil, puede llamar/localizar a su doctor(a) al nmero que aparece a continuacin.   Por favor, tenga en cuenta que aunque  hacemos todo lo posible para estar disponibles para asuntos urgentes fuera del horario de oficina, no estamos disponibles las 24 horas del da, los 7 das de la [place].   Si tiene un problema urgente y no puede comunicarse con nosotros, puede optar por buscar atencin mdica  en el consultorio de su doctor(a), en una clnica privada, en un centro de atencin urgente o en una sala de emergencias.  Si tiene Engineering geologist, por favor llame inmediatamente al 911 o vaya a la sala de emergencias.  Nmeros de bper  - Dr. Nehemiah Massed: [phone]  - Dra. Moye: [phone]  - Dra. Nicole Kindred: [phone]  En caso de inclemencias del [place], por favor llame a Johnsie Kindred principal al [phone] para una actualizacin sobre el [place] de cualquier retraso o cierre.  Consejos para la medicacin en dermatologa: Por favor, guarde las cajas en las que vienen los medicamentos de uso tpico para ayudarle a seguir las instrucciones sobre dnde y cmo usarlos. Las farmacias generalmente imprimen las instrucciones del medicamento slo en las cajas y no directamente en los tubos del [place].   Si su medicamento es muy caro, por favor, pngase en contacto con Zigmund Daniel llamando al [phone] y presione la  opcin 4 o envenos un mensaje a travs de Pharmacist, community.   No podemos decirle cul ser su copago por los medicamentos por adelantado ya que esto es diferente dependiendo de la cobertura de su seguro. Sin embargo, es posible que podamos encontrar un medicamento sustituto a Electrical engineer un formulario para que el seguro cubra el medicamento que se considera necesario.   Si se requiere una autorizacin previa para que su compaa de seguros [place] su medicamento, por favor permtanos de 1 a 2 das hbiles para completar este proceso.  Los precios de los medicamentos varan con frecuencia dependiendo del Environmental consultant de dnde se surte la receta y alguna farmacias pueden ofrecer precios ms baratos.  El sitio web www.goodrx.com tiene cupones para medicamentos de Airline pilot. Los precios aqu no tienen en cuenta lo que podra costar con la ayuda del seguro (puede ser ms barato con su seguro), pero el sitio web puede darle el precio si no utiliz Research scientist (physical sciences).  - Puede imprimir el cupn correspondiente y llevarlo con su receta a la farmacia.  - Tambin puede pasar por nuestra oficina durante el horario de atencin regular y Charity fundraiser una tarjeta de cupones de GoodRx.  - Si necesita que su receta se enve electrnicamente a una farmacia diferente, informe a nuestra oficina a travs de MyChart de [place] o por telfono llamando al [phone] y presione la opcin 4.

## 2022-02-15 NOTE — Progress Notes (Signed)
New Patient Visit   Subjective  Wanda Cervantes is a 71 y.o. female who presents for the following: Other (Patient here today concerning a spot at right lower leg, right thumbnail problems, itchy spot at mid upper back, spots at right knee that wont go away,  growth at left flank area, spot at right cheek that wont go away and itches states given topical but did not work, red spots at right breast dont go away. ).  The patient has spots, moles and lesions to be evaluated, some may be new or changing and the patient has concerns that these could be cancer.   The following portions of the chart were reviewed this encounter and updated as appropriate:  Tobacco  Allergies  Meds  Problems  Med Hx  Surg Hx  Fam Hx      Review of Systems: No other skin or systemic complaints except as noted in HPI or Assessment and Plan.   Objective  Well appearing patient in no apparent distress; mood and affect are within normal limits.  A focused examination was performed including right cheek, right breast, left flank, right pretibial , right knee, right thumbnail, back. Relevant physical exam findings are noted in the Assessment and Plan.  right lower pretibial x 1 Erythematous thin papules/macules with gritty scale at right cheek     R Knee x 2 (2) Verrucous papules -- Discussed viral etiology and contagion.   left flank x 1, mid upper back x 1 (2) Erythematous stuck-on, waxy papule or plaque  right thumbnail Onycholysis         Assessment & Plan  Actinic keratosis right lower pretibial x 1  Within porokeratosis at right lower pretibial   Related to sun   Discussed treatment, patient would like LN2 trx today    AK at right cheek - will recheck   Patient would like 5 fluorouracil and requests that it be sent to her local pharmacy instead of [place].   Start   5-fluorouracil cream twice a day for 7 days to affected areas including at right cheek . [facility] Patient provided with contact information for pharmacy and advised the pharmacy will mail the prescription to their home. Patient provided with handout reviewing treatment course and side effects and advised to call or message [place] on MyChart with any concerns.  Actinic keratoses are precancerous spots that appear secondary to cumulative UV radiation exposure/sun exposure over time. They are chronic with expected duration over 1 year. A portion of actinic keratoses will progress to squamous cell carcinoma of the skin. It is not possible to reliably predict which spots will progress to skin cancer and so treatment is recommended to prevent development of skin cancer.  Recommend daily broad spectrum sunscreen SPF 30+ to sun-exposed areas, reapply every 2 hours as needed.  Recommend staying in the shade or wearing long sleeves, sun glasses (UVA+UVB protection) and wide brim hats (4-inch brim around the entire circumference of the hat). Call for new or changing lesions.    Destruction of lesion - right lower pretibial x 1  Destruction method: cryotherapy   Informed consent: discussed and consent obtained   Lesion destroyed using liquid nitrogen: Yes   Cryotherapy cycles:  2 Outcome: patient tolerated procedure well with no complications   Post-procedure details: wound care instructions given   Additional details:  Prior to procedure, discussed risks of blister formation, small wound, skin dyspigmentation, or rare scar following cryotherapy. Recommend Vaseline ointment  to treated areas while healing.   fluorouracil (EFUDEX) 5 % cream - right lower pretibial x 1 Apply topically 2 (two) times daily. Apply to aa right cheek to 7 days  Viral warts, unspecified type (2) R Knee x 2  Discussed viral etiology and risk of spread.  Discussed multiple treatments may be required to clear warts.  Discussed possible post-treatment dyspigmentation and risk of recurrence.  Patient would like to treat  with ln2, cantharidin plus and squaric acid.   Squaric Acid 3% applied to warts today. Prior to application reviewed risk of inflammation and irritation.  Cantharidin Plus is a blistering agent that comes from a beetle.  It needs to be washed off in about 4 hours after application.  Although it is painless when applied in office, it may cause symptoms of mild pain and burning several hours later.  Treated areas will swell and turn red, and blisters may form.  Vaseline and a bandaid may be applied until wound has healed.  Once healed, the skin may remain temporarily discolored.  It can take weeks to months for pigmentation to return to normal.  Advised to wash off with soap and water in 4 hours or sooner if it becomes tender before then.      Destruction of lesion - R Knee x 2  Destruction method: cryotherapy   Informed consent: discussed and consent obtained   Timeout:  patient name, date of birth, surgical site, and procedure verified Lesion destroyed using liquid nitrogen: Yes   Cryotherapy cycles:  2 Outcome: patient tolerated procedure well with no complications   Post-procedure details: wound care instructions given   Additional details:  Prior to procedure, discussed risks of blister formation, small wound, skin dyspigmentation, or rare scar following cryotherapy. Recommend Vaseline ointment to treated areas while healing.   Destruction of lesion - R Knee x 2  Destruction method: chemical removal   Informed consent: discussed and consent obtained   Timeout:  patient name, date of birth, surgical site, and procedure verified Chemical destruction method: cantharidin   Chemical destruction method comment:  Plus Procedure instructions: patient instructed to wash and dry area   Outcome: patient tolerated procedure well with no complications   Post-procedure details: wound care instructions given    Inflamed seborrheic keratosis (2) left flank x 1, mid upper back x 1  Symptomatic,  irritating, patient would like treated.  Destruction of lesion - left flank x 1, mid upper back x 1  Destruction method: cryotherapy   Informed consent: discussed and consent obtained   Lesion destroyed using liquid nitrogen: Yes   Cryotherapy cycles:  2 Outcome: patient tolerated procedure well with no complications   Post-procedure details: wound care instructions given   Additional details:  Prior to procedure, discussed risks of blister formation, small wound, skin dyspigmentation, or rare scar following cryotherapy. Recommend Vaseline ointment to treated areas while healing.    Onycholysis right thumbnail  Benign-appearing  Discussed condition  Photo today  Recommend keeping nail trimmed, avoiding moisture under nail, covering hands with gloves to prevent moisture.   Start ciclopirox 0.77 % susp - apply topically bid to right under thumbnail.   Will recheck     ciclopirox (LOPROX) 0.77 % SUSP - right thumbnail Apply 1 Application topically 2 (two) times daily. To right thumbnail   Melanocytic Nevi At back x 2 - Tan-brown and/or pink-flesh-colored symmetric macules and papules - Benign appearing on exam today - Observation - Call clinic for new or changing moles -  Recommend daily use of broad spectrum spf 30+ sunscreen to sun-exposed areas.   Hemangiomas At right breast  - Red papules - Discussed benign nature - Observe - Call for any changes  Actinic Damage - chronic, secondary to cumulative UV radiation exposure/sun exposure over time - diffuse scaly erythematous macules with underlying dyspigmentation - Recommend daily broad spectrum sunscreen SPF 30+ to sun-exposed areas, reapply every 2 hours as needed.  - Recommend staying in the shade or wearing long sleeves, sun glasses (UVA+UVB protection) and wide brim hats (4-inch brim around the entire circumference of the hat). - Call for new or changing lesions.   Return in about 1 month (around 03/17/2022)  for wart follow up. I, Ruthell Rummage, CMA, am acting as scribe for Forest Gleason, MD.  Documentation: I have reviewed the above documentation for accuracy and completeness, and I agree with the above.  Forest Gleason, MD

## 2022-02-21 ENCOUNTER — Encounter: Payer: Self-pay | Admitting: Dermatology

## 2022-02-22 ENCOUNTER — Telehealth: Payer: Self-pay

## 2022-02-22 DIAGNOSIS — L570 Actinic keratosis: Secondary | ICD-10-CM

## 2022-02-22 MED ORDER — FLUOROURACIL 5 % EX CREA: TOPICAL_CREAM | Freq: Two times a day (BID) | CUTANEOUS | 1 refills | Status: AC

## 2022-02-22 NOTE — Telephone Encounter (Signed)
Pt called to discuss medications she has used in the past for Psoriasis because when she was here last week we informed her we lost charts and no longer had information on her last rx for psorasis, per pt she has used Halobetasol cream and Eucrisa cream-samples.  Pt would also like for her Efudex cream be called into [facility] because he local pharmacy will charge her over $100, okay erx'd Efudex cream to [facility]

## 2022-02-23 NOTE — Addendum Note (Signed)
Addended by: Alfonso Patten on: 02/23/2022 10:03 AM   Modules accepted: Level of Service

## 2022-03-06 ENCOUNTER — Telehealth: Payer: Self-pay

## 2022-03-06 NOTE — Telephone Encounter (Signed)
Patient left VM that she has not seen any results with 5FU Cream using BID for 7 days to area on face. Patient asked to continue to use. Advised patient to d/c RX since she completed therapy until follow up with Dr. Laurence Ferrari. Patient asked why she could not continue. I did advise patient this can be a harsh treatment and cause a lot of irritation to areas if this starts to work or if she notices any difference. Patient understands.

## 2022-03-23 ENCOUNTER — Ambulatory Visit: Payer: Medicare Other | Admitting: Dermatology

## 2022-04-11 ENCOUNTER — Ambulatory Visit: Payer: Medicare Other | Admitting: Dermatology

## 2022-04-12 ENCOUNTER — Ambulatory Visit: Payer: Medicare Other | Admitting: Dermatology

## 2022-04-27 ENCOUNTER — Ambulatory Visit (INDEPENDENT_AMBULATORY_CARE_PROVIDER_SITE_OTHER): Payer: Medicare Other | Admitting: Dermatology

## 2022-04-27 DIAGNOSIS — S90852A Superficial foreign body, left foot, initial encounter: Secondary | ICD-10-CM

## 2022-04-27 DIAGNOSIS — L570 Actinic keratosis: Secondary | ICD-10-CM | POA: Diagnosis not present

## 2022-04-27 DIAGNOSIS — S90851A Superficial foreign body, right foot, initial encounter: Secondary | ICD-10-CM

## 2022-04-27 DIAGNOSIS — B070 Plantar wart: Secondary | ICD-10-CM

## 2022-04-27 MED ORDER — MUPIROCIN 2 % EX OINT: 1.0000 | TOPICAL_OINTMENT | Freq: Every day | CUTANEOUS | 0 refills | Status: AC

## 2022-04-27 NOTE — Progress Notes (Signed)
Follow-Up Visit   Subjective  Wanda Cervantes is a 71 y.o. female who presents for the following: Skin Problem (Patient here today with a piece of glass in her left foot, happened 4 days ago when she dropped a saucer. Patient's husband tried to get it out but she thinks there is still some in there. ).   The following portions of the chart were reviewed this encounter and updated as appropriate:   Tobacco  Allergies  Meds  Problems  Med Hx  Surg Hx  Fam Hx      Review of Systems:  No other skin or systemic complaints except as noted in HPI or Assessment and Plan.  Objective  Well appearing patient in no apparent distress; mood and affect are within normal limits.  A focused examination was performed including feet, legs. Relevant physical exam findings are noted in the Assessment and Plan.  right heel x 4, left heel x 3, left ball of foot x 2 (9) Verrucous papules -- Discussed viral etiology and contagion.   Left Lateral Plantar Surface Small open wound without residual glass  right pretibia x 2 (2) Erythematous thin papules/macules with gritty scale.     Assessment & Plan  Plantar wart (9) right heel x 4, left heel x 3, left ball of foot x 2  Discussed viral etiology and risk of spread.  Discussed multiple treatments may be required to clear warts.  Discussed possible post-treatment dyspigmentation and risk of recurrence.  Prior to procedure, discussed risks of blister formation, small wound, skin dyspigmentation, or rare scar following cryotherapy. Recommend Vaseline ointment to treated areas while healing.  Squaric Acid 3% applied to warts today. Prior to application reviewed risk of inflammation and irritation.  Cantharidin Plus is a blistering agent that comes from a beetle.  It needs to be washed off in about 4 hours after application.  Although it is painless when applied in office, it may cause symptoms of mild pain and burning several hours later.  Treated  areas will swell and turn red, and blisters may form.  Vaseline and a bandaid may be applied until wound has healed.  Once healed, the skin may remain temporarily discolored.  It can take weeks to months for pigmentation to return to normal.  Advised to wash off with soap and water in 4 hours or sooner if it becomes tender before then.   Destruction of lesion - right heel x 4, left heel x 3, left ball of foot x 2  Destruction method: cryotherapy   Informed consent: discussed and consent obtained   Lesion destroyed using liquid nitrogen: Yes   Cryotherapy cycles:  2 Outcome: patient tolerated procedure well with no complications   Post-procedure details: wound care instructions given    Destruction of lesion - right heel x 4, left heel x 3, left ball of foot x 2  Destruction method: chemical removal   Informed consent: discussed and consent obtained   Timeout:  patient name, date of birth, surgical site, and procedure verified Chemical destruction method: cantharidin   Chemical destruction method comment:  Cantharidin plus, squaric acid 3% Application time:  4 hours Procedure instructions: patient instructed to wash and dry area   Outcome: patient tolerated procedure well with no complications   Post-procedure details: wound care instructions given   Additional details:  Patient advised to set alarm to remind them to wash off with soap and water at the directed time.  Foreign body in right foot, initial  encounter Left Lateral Plantar Surface  Reassured no glass visible on dermascopic exam today. She does have central crust which may be making the area feel more tender and firm. Start mupirocin daily and cover with band aid until healed  mupirocin ointment (BACTROBAN) 2 % - Left Lateral Plantar Surface Apply 1 Application topically daily.  AK (actinic keratosis) (2) right pretibia x 2  Actinic keratoses are precancerous spots that appear secondary to cumulative UV radiation  exposure/sun exposure over time. They are chronic with expected duration over 1 year. A portion of actinic keratoses will progress to squamous cell carcinoma of the skin. It is not possible to reliably predict which spots will progress to skin cancer and so treatment is recommended to prevent development of skin cancer.  Recommend daily broad spectrum sunscreen SPF 30+ to sun-exposed areas, reapply every 2 hours as needed.  Recommend staying in the shade or wearing long sleeves, sun glasses (UVA+UVB protection) and wide brim hats (4-inch brim around the entire circumference of the hat). Call for new or changing lesions.  Prior to procedure, discussed risks of blister formation, small wound, skin dyspigmentation, or rare scar following cryotherapy. Recommend Vaseline ointment to treated areas while healing.   Destruction of lesion - right pretibia x 2  Destruction method: cryotherapy   Informed consent: discussed and consent obtained   Lesion destroyed using liquid nitrogen: Yes   Cryotherapy cycles:  2 Outcome: patient tolerated procedure well with no complications   Post-procedure details: wound care instructions given     Return in about 8 weeks (around 06/22/2022) for Warts.  Graciella Belton, RMA, am acting as scribe for Forest Gleason, MD .  Documentation: I have reviewed the above documentation for accuracy and completeness, and I agree with the above.  Forest Gleason, MD

## 2022-04-27 NOTE — Patient Instructions (Signed)
Cryotherapy Aftercare  Wash gently with soap and water everyday.   Apply Vaseline and Band-Aid daily until healed.   Instructions for After In-Office Application of Cantharidin  1. This is a strong medicine; please follow ALL instructions.  2. Gently wash off with soap and water in four hours or sooner s directed by your physician.  3. **WARNING** this medicine can cause severe blistering, blood blisters, infection, and/or scarring if it is not washed off as directed.  4. Your progress will be rechecked in 1-2 months; call sooner if there are any questions or problems.  Cantharidin is a blistering agent that comes from a beetle.  It needs to be washed off in about 4 hours after application.  Although it is painless when applied in office, it may cause symptoms of mild pain and burning several hours later.  Treated areas will swell and turn red, and blisters may form.  Vaseline and a bandaid may be applied until wound has healed.  Once healed, the skin may remain temporarily discolored.  It can take weeks to months for pigmentation to return to normal.  The molluscum may resolve with this topical treatment, but often, additional treatments may be required to clear molluscum.  It is recommended to keep the skin well-moisturized and avoid scratching affected area to help prevent spread of the molluscum.   Due to recent changes in healthcare laws, you may see results of your pathology and/or laboratory studies on MyChart before the doctors have had a chance to review them. We understand that in some cases there may be results that are confusing or concerning to you. Please understand that not all results are received at the same time and often the doctors may need to interpret multiple results in order to provide you with the best plan of care or course of treatment. Therefore, we ask that you please give [place] 2 business days to thoroughly review all your results before contacting the office for  clarification. Should we see a critical lab result, you will be contacted sooner.   If You Need Anything After Your Visit  If you have any questions or concerns for your doctor, please call our main line at [phone] and press option 4 to reach your doctor's medical assistant. If no one answers, please leave a voicemail as directed and we will return your call as soon as possible. Messages left after 4 pm will be answered the following business day.   You may also send [place] a message via [place]. We typically respond to MyChart messages within 1-2 business days.  For prescription refills, please ask your pharmacy to contact our office. Our fax number is [phone].  If you have an urgent issue when the clinic is closed that cannot wait until the next business day, you can page your doctor at the number below.    Please note that while we do our best to be available for urgent issues outside of office hours, we are not available 24/7.   If you have an urgent issue and are unable to reach [place], you may choose to seek medical care at your doctor's office, retail clinic, urgent care center, or emergency room.  If you have a medical emergency, please immediately call 911 or go to the emergency department.  Pager Numbers  - Dr. Nehemiah Massed: [phone]  - Dr. Laurence Ferrari: [phone]  - Dr. Nicole Kindred: [phone]  In the event of inclement weather, please call our main line at [phone] for an update on the  status of any delays or closures.  Dermatology Medication Tips: Please keep the boxes that topical medications come in in order to help keep track of the instructions about where and how to use these. Pharmacies typically print the medication instructions only on the boxes and not directly on the medication tubes.   If your medication is too expensive, please contact our office at [phone] option 4 or send [place] a message through [place].   We are unable to tell what your co-pay for  medications will be in advance as this is different depending on your insurance coverage. However, we may be able to find a substitute medication at lower cost or fill out paperwork to get insurance to cover a needed medication.   If a prior authorization is required to get your medication covered by your insurance company, please allow [place] 1-2 business days to complete this process.  Drug prices often vary depending on where the prescription is filled and some pharmacies may offer cheaper prices.  The website www.goodrx.com contains coupons for medications through different pharmacies. The prices here do not account for what the cost may be with help from insurance (it may be cheaper with your insurance), but the website can give you the price if you did not use any insurance.  - You can print the associated coupon and take it with your prescription to the pharmacy.  - You may also stop by our office during regular business hours and pick up a GoodRx coupon card.  - If you need your prescription sent electronically to a different pharmacy, notify our office through [facility] or by phone at [phone] option 4.     Si Usted Necesita Algo Despus de Su Visita  Tambin puede enviarnos un mensaje a travs de Pharmacist, community. Por lo general respondemos a los mensajes de MyChart en el transcurso de 1 a 2 das hbiles.  Para renovar recetas, por favor pida a su farmacia que se ponga en contacto con nuestra oficina. Harland Dingwall de fax es [place] [phone].  Si tiene un asunto urgente cuando la clnica est cerrada y que no puede esperar hasta el siguiente da hbil, puede llamar/localizar a su doctor(a) al nmero que aparece a continuacin.   Por favor, tenga en cuenta que aunque hacemos todo lo posible para estar disponibles para asuntos urgentes fuera del horario de [place], no estamos disponibles las 24 horas del da, los 7 das de la [place].   Si tiene un problema urgente y no puede  comunicarse con nosotros, puede optar por buscar atencin mdica  en el consultorio de su doctor(a), en una clnica privada, en un centro de atencin urgente o en una sala de emergencias.  Si tiene Engineering geologist, por favor llame inmediatamente al 911 o vaya a la sala de emergencias.  Nmeros de bper  - Dr. Nehemiah Massed: [phone]  - Dra. Moye: [phone]  - Dra. Nicole Kindred: [phone]  En caso de inclemencias del [place], por favor llame a Johnsie Kindred principal al [phone] para una actualizacin sobre el [place] de cualquier retraso o cierre.  Consejos para la medicacin en dermatologa: Por favor, guarde las cajas en las que vienen los medicamentos de uso tpico para ayudarle a seguir las instrucciones sobre dnde y cmo usarlos. Las farmacias generalmente imprimen las instrucciones del medicamento slo en las cajas y no directamente en los tubos del [place].   Si su medicamento es muy caro, por favor, pngase en contacto con Zigmund Daniel llamando al [phone] y  presione la opcin 4 o envenos un mensaje a travs de MyChart.   No podemos decirle cul ser su copago por los medicamentos por adelantado ya que esto es diferente dependiendo de la cobertura de su seguro. Sin embargo, es posible que podamos encontrar un medicamento sustituto a Electrical engineer un formulario para que el seguro cubra el medicamento que se considera necesario.   Si se requiere una autorizacin previa para que su compaa de seguros [place] su medicamento, por favor permtanos de 1 a 2 das hbiles para completar este proceso.  Los precios de los medicamentos varan con frecuencia dependiendo del Environmental consultant de dnde se surte la receta y alguna farmacias pueden ofrecer precios ms baratos.  El sitio web www.goodrx.com tiene cupones para medicamentos de Airline pilot. Los precios aqu no tienen en cuenta lo que podra costar con la ayuda del seguro (puede ser ms barato con su seguro), pero  el sitio web puede darle el precio si no utiliz Research scientist (physical sciences).  - Puede imprimir el cupn correspondiente y llevarlo con su receta a la farmacia.  - Tambin puede pasar por nuestra oficina durante el horario de atencin regular y Charity fundraiser una tarjeta de cupones de GoodRx.  - Si necesita que su receta se enve electrnicamente a una farmacia diferente, informe a nuestra oficina a travs de MyChart de [place] o por telfono llamando al [phone] y presione la opcin 4.

## 2022-05-08 ENCOUNTER — Encounter: Payer: Self-pay | Admitting: Dermatology

## 2022-05-19 DIAGNOSIS — M47816 Spondylosis without myelopathy or radiculopathy, lumbar region: Secondary | ICD-10-CM | POA: Diagnosis not present

## 2022-05-19 DIAGNOSIS — M5489 Other dorsalgia: Secondary | ICD-10-CM | POA: Diagnosis not present

## 2022-05-19 DIAGNOSIS — M4156 Other secondary scoliosis, lumbar region: Secondary | ICD-10-CM | POA: Diagnosis not present

## 2022-05-19 DIAGNOSIS — M65341 Trigger finger, right ring finger: Secondary | ICD-10-CM | POA: Diagnosis not present

## 2022-06-22 ENCOUNTER — Ambulatory Visit: Payer: Medicare Other | Admitting: Dermatology

## 2022-11-06 ENCOUNTER — Telehealth: Payer: Self-pay

## 2022-11-06 NOTE — Telephone Encounter (Signed)
Patient didn't know if there were any at home treatment for milium under the eye? She had read about trying honey what are your thoughts on this? Thank you.

## 2022-11-07 NOTE — Telephone Encounter (Signed)
I am not aware of honey working. She can try OTC differin (adapalene) cream nightly or La Roche Posay Effaclar. Both of these could cause some irritation. Thank you!

## 2022-11-08 NOTE — Telephone Encounter (Signed)
Patient advised.

## 2022-12-06 ENCOUNTER — Ambulatory Visit (INDEPENDENT_AMBULATORY_CARE_PROVIDER_SITE_OTHER): Payer: Medicare Other | Admitting: Dermatology

## 2022-12-06 VITALS — BP 164/84 | HR 98

## 2022-12-06 DIAGNOSIS — B079 Viral wart, unspecified: Secondary | ICD-10-CM | POA: Diagnosis not present

## 2022-12-06 DIAGNOSIS — L570 Actinic keratosis: Secondary | ICD-10-CM

## 2022-12-06 DIAGNOSIS — L720 Epidermal cyst: Secondary | ICD-10-CM | POA: Diagnosis not present

## 2022-12-06 DIAGNOSIS — L820 Inflamed seborrheic keratosis: Secondary | ICD-10-CM

## 2022-12-06 DIAGNOSIS — L578 Other skin changes due to chronic exposure to nonionizing radiation: Secondary | ICD-10-CM | POA: Diagnosis not present

## 2022-12-06 DIAGNOSIS — L821 Other seborrheic keratosis: Secondary | ICD-10-CM | POA: Diagnosis not present

## 2022-12-06 DIAGNOSIS — L988 Other specified disorders of the skin and subcutaneous tissue: Secondary | ICD-10-CM

## 2022-12-06 MED ORDER — TRIAMCINOLONE ACETONIDE 0.1 % EX CREA: TOPICAL_CREAM | CUTANEOUS | 0 refills | Status: AC

## 2022-12-06 NOTE — Progress Notes (Signed)
Follow-Up Visit   Subjective  Wanda Cervantes is a 72 y.o. female who presents for the following: Wart - on the heels, pt thinks they may still be there and she would like area checked today. AK f/u at R cheek, pt used 5FU QD for about two months, but didn't experience a reaction. She also has irritated/irregular skin lesions on the back and R thigh that she would like checked today.    The following portions of the chart were reviewed this encounter and updated as appropriate: medications, allergies, medical history  Review of Systems:  No other skin or systemic complaints except as noted in HPI or Assessment and Plan.  Objective  Well appearing patient in no apparent distress; mood and affect are within normal limits.  Areas Examined: The face, feet, and trunk  Relevant physical exam findings are noted in the Assessment and Plan.  Back, R thigh Erythematous stuck-on, waxy papule or plaque  R cheek x 1 Scaly erythematous tan papule with coronoidlamella. Without features suspicious for malignancy on dermoscopy.     Assessment & Plan   Inflamed seborrheic keratosis Back, R thigh  Benign-appearing.  Observation.  Call clinic for new or changing lesions.   May use Aquaphor ointment or TMC 0.1% cream BID up to two weeks PRN. Can treat with LN2 if bothersome in future.  AK (actinic keratosis) R cheek x 1  Within porokeratosis, consider bx if not resolved  Destruction of lesion - R cheek x 1  Destruction method: cryotherapy   Informed consent: discussed and consent obtained   Lesion destroyed using liquid nitrogen: Yes   Cryotherapy cycles:  2 Outcome: patient tolerated procedure well with no complications   Post-procedure details: wound care instructions given      WART Exam: verrucous papule(s) R plantar foot x 5, L plantar foot x 2  Discussed viral / HPV (Human Papilloma Virus) etiology and risk of spread /infectivity to other areas of body as well as to other  people.  Multiple treatments and methods may be required to clear warts and it is possible treatment may not be successful.  Treatment risks include discoloration; scarring and there is still potential for wart recurrence.  Treatment Plan: Destruction Procedure Note Destruction method: chemical removal Informed consent: discussed and consent obtained   Chemical destruction method: Cantharone Plus Outcome: patient tolerated procedure well with no complications   Post-procedure details: Advised to wash off with soap and water in 4 hours or sooner if it becomes tender before then. Locations: R plantar foot x 5, L plantar foot x 2 # of Lesions Treated: 7  Prior to procedure discussed that Cantharidin Plus is a blistering agent that comes from a beetle.  It needs to be washed off in about 4 hours after application.  Although it is painless when applied in office, it may cause symptoms of mild pain and burning several hours later.  Treated areas will swell and turn red, and blisters may form.  Vaseline and a bandaid may be applied until wound has healed.  Once healed, the skin may remain temporarily discolored.  It can take weeks to months for pigmentation to return to normal.    Destruction Procedure Note Destruction method: cryotherapy   Informed consent: discussed and consent obtained   Lesion destroyed using liquid nitrogen: Yes   Outcome: patient tolerated procedure well with no complications   Post-procedure details: wound care instructions given   Locations: R plantar foot x 5, L plantar foot x  2 # of Lesions Treated: 7  Prior to procedure, discussed risks of blister formation, small wound, skin dyspigmentation, or rare scar following cryotherapy. Recommend Vaseline ointment to treated areas while healing.  Squaric Acid 3% applied to warts today. Prior to application reviewed risk of inflammation and irritation.   Squaric acid 3% sensitization applied to L upper arm. If rash occurs use  TMC 0.1% cream BID up to two weeks PRN. Topical steroids (such as triamcinolone, fluocinolone, fluocinonide, mometasone, clobetasol, halobetasol, betamethasone, hydrocortisone) can cause thinning and lightening of the skin if they are used for too long in the same area. Your physician has selected the right strength medicine for your problem and area affected on the body. Please use your medication only as directed by your physician to prevent side effects.   SEBORRHEIC KERATOSIS - Stuck-on, waxy, tan-brown papules and/or plaques  - Benign-appearing - Discussed benign etiology and prognosis. - Observe - Call for any changes  ACTINIC DAMAGE - chronic, secondary to cumulative UV radiation exposure/sun exposure over time - diffuse scaly erythematous macules with underlying dyspigmentation - Recommend daily broad spectrum sunscreen SPF 30+ to sun-exposed areas, reapply every 2 hours as needed.  - Recommend staying in the shade or wearing long sleeves, sun glasses (UVA+UVB protection) and wide brim hats (4-inch brim around the entire circumference of the hat). - Call for new or changing lesions.  Milia - not improving with OTC Adapalene - Recommend OTC La Roche Possay Effaclar cream use QHS and adapalene 0.1% cream nightly - tiny firm white papules - type of cyst - benign - may be extracted if symptomatic - Benign-appearing.  Observation.  Call clinic for new or changing lesions.    FACIAL ELASTOSIS Exam: Rhytides and volume loss.  Treatment Plan: Discussed with patient that surgery is the best solution for the herniated fat pad, consider fillers to camouflage under eye circles. May continue Alastin eye product.   Recommend daily broad spectrum sunscreen SPF 30+ to sun-exposed areas, reapply every 2 hours as needed. Call for new or changing lesions.  Staying in the shade or wearing long sleeves, sun glasses (UVA+UVB protection) and wide brim hats (4-inch brim around the entire  circumference of the hat) are also recommended for sun protection.   Return for wart and AK f/u in 4 wks.  Maylene Roes, CMA, am acting as scribe for Darden Dates, MD .  Documentation: I have reviewed the above documentation for accuracy and completeness, and I agree with the above.  Darden Dates, MD

## 2022-12-06 NOTE — Patient Instructions (Signed)
Due to recent changes in healthcare laws, you may see results of your pathology and/or laboratory studies on MyChart before the doctors have had a chance to review them. We understand that in some cases there may be results that are confusing or concerning to you. Please understand that not all results are received at the same time and often the doctors may need to interpret multiple results in order to provide you with the best plan of care or course of treatment. Therefore, we ask that you please give us 2 business days to thoroughly review all your results before contacting the office for clarification. Should we see a critical lab result, you will be contacted sooner.   If You Need Anything After Your Visit  If you have any questions or concerns for your doctor, please call our main line at [phone] and press option 4 to reach your doctor's medical assistant. If no one answers, please leave a voicemail as directed and we will return your call as soon as possible. Messages left after 4 pm will be answered the following business day.   You may also send us a message via MyChart. We typically respond to MyChart messages within 1-2 business days.  For prescription refills, please ask your pharmacy to contact our office. Our fax number is [phone].  If you have an urgent issue when the clinic is closed that cannot wait until the next business day, you can page your doctor at the number below.    Please note that while we do our best to be available for urgent issues outside of office hours, we are not available 24/7.   If you have an urgent issue and are unable to reach us, you may choose to seek medical care at your doctor's office, retail clinic, urgent care center, or emergency room.  If you have a medical emergency, please immediately call 911 or go to the emergency department.  Pager Numbers  - Dr. Kowalski: [phone]  - Dr. Moye: [phone]  - Dr. Stewart:  [phone]  In the event of inclement weather, please call our main line at [phone] for an update on the status of any delays or closures.  Dermatology Medication Tips: Please keep the boxes that topical medications come in in order to help keep track of the instructions about where and how to use these. Pharmacies typically print the medication instructions only on the boxes and not directly on the medication tubes.   If your medication is too expensive, please contact our office at [phone] option 4 or send us a message through MyChart.   We are unable to tell what your co-pay for medications will be in advance as this is different depending on your insurance coverage. However, we may be able to find a substitute medication at lower cost or fill out paperwork to get insurance to cover a needed medication.   If a prior authorization is required to get your medication covered by your insurance company, please allow us 1-2 business days to complete this process.  Drug prices often vary depending on where the prescription is filled and some pharmacies may offer cheaper prices.  The website www.goodrx.com contains coupons for medications through different pharmacies. The prices here do not account for what the cost may be with help from insurance (it may be cheaper with your insurance), but the website can give you the price if you did not use any insurance.  - You can print the associated coupon and take it with   your prescription to the pharmacy.  - You may also stop by our office during regular business hours and pick up a GoodRx coupon card.  - If you need your prescription sent electronically to a different pharmacy, notify our office through [facility] MyChart or by phone at [phone] option 4.     Si Usted Necesita Algo Despus de Su Visita  Tambin puede enviarnos un mensaje a travs de MyChart. Por lo general respondemos a los mensajes de MyChart en el transcurso de 1 a 2  das hbiles.  Para renovar recetas, por favor pida a su farmacia que se ponga en contacto con nuestra oficina. Nuestro nmero de fax es el [phone].  Si tiene un asunto urgente cuando la clnica est cerrada y que no puede esperar hasta el siguiente da hbil, puede llamar/localizar a su doctor(a) al nmero que aparece a continuacin.   Por favor, tenga en cuenta que aunque hacemos todo lo posible para estar disponibles para asuntos urgentes fuera del horario de oficina, no estamos disponibles las 24 horas del da, los 7 das de la semana.   Si tiene un problema urgente y no puede comunicarse con nosotros, puede optar por buscar atencin mdica  en el consultorio de su doctor(a), en una clnica privada, en un centro de atencin urgente o en una sala de emergencias.  Si tiene una emergencia mdica, por favor llame inmediatamente al 911 o vaya a la sala de emergencias.  Nmeros de bper  - Dr. Kowalski: [phone]  - Dra. Moye: [phone]  - Dra. Stewart: [phone]  En caso de inclemencias del tiempo, por favor llame a nuestra lnea principal al [phone] para una actualizacin sobre el estado de cualquier retraso o cierre.  Consejos para la medicacin en dermatologa: Por favor, guarde las cajas en las que vienen los medicamentos de uso tpico para ayudarle a seguir las instrucciones sobre dnde y cmo usarlos. Las farmacias generalmente imprimen las instrucciones del medicamento slo en las cajas y no directamente en los tubos del medicamento.   Si su medicamento es muy caro, por favor, pngase en contacto con nuestra oficina llamando al [phone] y presione la opcin 4 o envenos un mensaje a travs de MyChart.   No podemos decirle cul ser su copago por los medicamentos por adelantado ya que esto es diferente dependiendo de la cobertura de su seguro. Sin embargo, es posible que podamos encontrar un medicamento sustituto a menor costo o llenar un formulario para que el  seguro cubra el medicamento que se considera necesario.   Si se requiere una autorizacin previa para que su compaa de seguros cubra su medicamento, por favor permtanos de 1 a 2 das hbiles para completar este proceso.  Los precios de los medicamentos varan con frecuencia dependiendo del lugar de dnde se surte la receta y alguna farmacias pueden ofrecer precios ms baratos.  El sitio web www.goodrx.com tiene cupones para medicamentos de diferentes farmacias. Los precios aqu no tienen en cuenta lo que podra costar con la ayuda del seguro (puede ser ms barato con su seguro), pero el sitio web puede darle el precio si no utiliz ningn seguro.  - Puede imprimir el cupn correspondiente y llevarlo con su receta a la farmacia.  - Tambin puede pasar por nuestra oficina durante el horario de atencin regular y recoger una tarjeta de cupones de GoodRx.  - Si necesita que su receta se enve electrnicamente a una farmacia diferente, informe a nuestra oficina a travs de MyChart de [facility]   o por telfono llamando al [phone] y presione la opcin 4.  

## 2022-12-11 ENCOUNTER — Telehealth: Payer: Self-pay

## 2022-12-11 NOTE — Telephone Encounter (Signed)
Patent called with questions concerning last visit and how she was suppose to use TMC that was sent in for her. She was sensitized at last visit and warts were treated at feet. I explained that TMC was for her to use at left upper arm for rash twice daily for up to 2 weeks as needed. Patient said she did use a little of it at feet where warts were treated. She was also concerned that the areas she pointed out and are bothersome for her were never addressed at her visit. I tried to explain that seborrheic keratoses are benign and that normally we do not treat them because they are benign. I suggested that when she come back for follow up she let the MA know if there are irritated areas she wants treated. Butch Penny., RMA

## 2022-12-12 NOTE — Telephone Encounter (Signed)
Thank you! Please let her know if any of those spots are itchy or irritated, we can treat them at her follow-up next month, or if they are not itchy/rubbing but she wants them removed, we can treat them, but it is a cosmetic charge.  If she does not have a rash at her arm, she does not need to use the triamcinolone anywhere. Thank you!

## 2022-12-13 NOTE — Telephone Encounter (Signed)
Tried calling patient back to go over provider's response. No answer. LMOM for patient to call office.

## 2022-12-28 ENCOUNTER — Ambulatory Visit: Payer: Medicare Other | Admitting: Dermatology

## 2023-01-03 ENCOUNTER — Ambulatory Visit: Payer: Medicare Other | Admitting: Dermatology

## 2024-05-08 ENCOUNTER — Ambulatory Visit

## 2024-05-26 ENCOUNTER — Ambulatory Visit
